# Patient Record
Sex: Female | Born: 1995 | Race: White | Hispanic: No | Marital: Single | Smoking: Never smoker
Health system: Southern US, Community
[De-identification: ages and names within clinical notes are randomized; demographics above are authoritative.]

## PROBLEM LIST (undated history)

## (undated) ENCOUNTER — Emergency Department (HOSPITAL_COMMUNITY): Admission: EM | Payer: Self-pay | Source: Home / Self Care

## (undated) DIAGNOSIS — F4310 Post-traumatic stress disorder, unspecified: Secondary | ICD-10-CM

## (undated) DIAGNOSIS — Z9889 Other specified postprocedural states: Secondary | ICD-10-CM

## (undated) DIAGNOSIS — F32A Depression, unspecified: Secondary | ICD-10-CM

## (undated) DIAGNOSIS — N6452 Nipple discharge: Secondary | ICD-10-CM

## (undated) DIAGNOSIS — Z973 Presence of spectacles and contact lenses: Secondary | ICD-10-CM

## (undated) DIAGNOSIS — N83209 Unspecified ovarian cyst, unspecified side: Secondary | ICD-10-CM

## (undated) DIAGNOSIS — T4145XA Adverse effect of unspecified anesthetic, initial encounter: Secondary | ICD-10-CM

## (undated) DIAGNOSIS — F329 Major depressive disorder, single episode, unspecified: Secondary | ICD-10-CM

## (undated) DIAGNOSIS — N809 Endometriosis, unspecified: Secondary | ICD-10-CM

## (undated) DIAGNOSIS — R112 Nausea with vomiting, unspecified: Secondary | ICD-10-CM

## (undated) DIAGNOSIS — T8859XA Other complications of anesthesia, initial encounter: Secondary | ICD-10-CM

## (undated) DIAGNOSIS — R102 Pelvic and perineal pain: Secondary | ICD-10-CM

## (undated) HISTORY — PX: TYMPANOSTOMY TUBE PLACEMENT: SHX32

## (undated) HISTORY — PX: TONSILLECTOMY: SUR1361

---
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
## 2016-06-28 ENCOUNTER — Encounter (HOSPITAL_BASED_OUTPATIENT_CLINIC_OR_DEPARTMENT_OTHER): Payer: Self-pay | Admitting: *Deleted

## 2016-06-28 NOTE — Progress Notes (Signed)
NPO AFTER MN.  ARRIVE AT 0600. NEEDS URINE PREG.  GETTING LAB WORK DONE Thursday 06-30-2016 (CBC, BMET).

## 2016-06-30 DIAGNOSIS — Z880 Allergy status to penicillin: Secondary | ICD-10-CM | POA: Diagnosis not present

## 2016-06-30 DIAGNOSIS — N946 Dysmenorrhea, unspecified: Secondary | ICD-10-CM | POA: Diagnosis not present

## 2016-06-30 DIAGNOSIS — F4310 Post-traumatic stress disorder, unspecified: Secondary | ICD-10-CM | POA: Diagnosis not present

## 2016-06-30 DIAGNOSIS — N809 Endometriosis, unspecified: Secondary | ICD-10-CM | POA: Diagnosis not present

## 2016-06-30 LAB — BASIC METABOLIC PANEL
Anion gap: 6 (ref 5–15)
BUN: 11 mg/dL (ref 6–20)
CALCIUM: 9.5 mg/dL (ref 8.9–10.3)
CHLORIDE: 107 mmol/L (ref 101–111)
CO2: 25 mmol/L (ref 22–32)
CREATININE: 0.84 mg/dL (ref 0.44–1.00)
GFR calc Af Amer: >60 mL/min (ref >60)
GFR calc non Af Amer: >60 mL/min (ref >60)
GLUCOSE: 87 mg/dL (ref 65–99)
Potassium: 3.8 mmol/L (ref 3.5–5.1)
Sodium: 138 mmol/L (ref 135–145)

## 2016-06-30 LAB — CBC
HCT: 39.5 % (ref 36.0–46.0)
Hemoglobin: 13.9 g/dL (ref 12.0–15.0)
MCH: 31.5 pg (ref 26.0–34.0)
MCHC: 35.2 g/dL (ref 30.0–36.0)
MCV: 89.6 fL (ref 78.0–100.0)
PLATELETS: 197 10*3/uL (ref 150–400)
RBC: 4.41 MIL/uL (ref 3.87–5.11)
RDW: 12 % (ref 11.5–15.5)
WBC: 4.7 10*3/uL (ref 4.0–10.5)

## 2016-07-03 NOTE — H&P (Addendum)
Summer Pruitt is an 21 y.o. female G0 presents for surgical mngt of chronic pelvic pain.  Pt reports severe dysmenorrhea since menarche and sx are worsening.  No improvement of sx with OCPs.     Menstrual History: Patient's last menstrual period was 06/01/2016 (approximate).    Past Medical History:  Diagnosis Date  . Pelvic pain   . PTSD (post-traumatic stress disorder)    brother committed suicide  . Wears glasses     Past Surgical History:  Procedure Laterality Date  . TYMPANOSTOMY TUBE PLACEMENT Bilateral child    History reviewed. No pertinent family history.  Social History:  reports that she has never smoked. She has never used smokeless tobacco. She reports that she does not drink alcohol or use drugs.  Allergies:  Allergies  Allergen Reactions  . Amoxicillin Shortness Of Breath and Rash  . Doxycycline Shortness Of Breath and Rash  . Zithromax [Azithromycin] Shortness Of Breath and Rash    Meds:  latuda, gabapentin, ibuprofen, OCPs, prozonian  ROS  Height 5\' 5"  (1.651 m), weight 146 lb (66.2 kg), last menstrual period 06/01/2016. Physical Exam Gen - NAD Abd - soft, NT/ND Ext - nt, no edema PV - deferred, pt doesn't tolerate well  PV US:  Negative for uterine of adnexal abnormalities  Assessment/Plan: Chronic pelvic pain and dysmenorrhea Diagnostic laparoscopy possible fulgeration of endometriosis r/b/a discussed, questions answered, informed consent  Coty Larsh 07/03/2016, 9:43 PM

## 2016-07-04 ENCOUNTER — Ambulatory Visit (HOSPITAL_BASED_OUTPATIENT_CLINIC_OR_DEPARTMENT_OTHER): Payer: BLUE CROSS/BLUE SHIELD | Admitting: Anesthesiology

## 2016-07-04 ENCOUNTER — Encounter (HOSPITAL_BASED_OUTPATIENT_CLINIC_OR_DEPARTMENT_OTHER)
Admission: RE | Disposition: A | Discharge status: Home / Self Care | Payer: Self-pay | Source: Ambulatory Visit | Attending: Obstetrics and Gynecology

## 2016-07-04 ENCOUNTER — Ambulatory Visit (HOSPITAL_BASED_OUTPATIENT_CLINIC_OR_DEPARTMENT_OTHER)
Admission: RE | Admit: 2016-07-04 | Discharge: 2016-07-04 | Disposition: A | Discharge status: Home / Self Care | Payer: BLUE CROSS/BLUE SHIELD | Source: Ambulatory Visit | Attending: Obstetrics and Gynecology | Admitting: Obstetrics and Gynecology

## 2016-07-04 ENCOUNTER — Encounter (HOSPITAL_BASED_OUTPATIENT_CLINIC_OR_DEPARTMENT_OTHER): Payer: Self-pay

## 2016-07-04 DIAGNOSIS — F4310 Post-traumatic stress disorder, unspecified: Secondary | ICD-10-CM | POA: Insufficient documentation

## 2016-07-04 DIAGNOSIS — N809 Endometriosis, unspecified: Secondary | ICD-10-CM | POA: Insufficient documentation

## 2016-07-04 DIAGNOSIS — Z880 Allergy status to penicillin: Secondary | ICD-10-CM | POA: Insufficient documentation

## 2016-07-04 DIAGNOSIS — N946 Dysmenorrhea, unspecified: Secondary | ICD-10-CM | POA: Insufficient documentation

## 2016-07-04 HISTORY — PX: LAPAROSCOPY: SHX197

## 2016-07-04 HISTORY — DX: Presence of spectacles and contact lenses: Z97.3

## 2016-07-04 HISTORY — DX: Pelvic and perineal pain: R10.2

## 2016-07-04 HISTORY — DX: Post-traumatic stress disorder, unspecified: F43.10

## 2016-07-04 LAB — POCT PREGNANCY, URINE: PREG TEST UR: NEGATIVE

## 2016-07-04 SURGERY — LAPAROSCOPY, DIAGNOSTIC
Anesthesia: General

## 2016-07-04 MED ORDER — SUGAMMADEX SODIUM 500 MG/5ML IV SOLN
INTRAVENOUS | Status: AC
  Filled 2016-07-04: qty 5

## 2016-07-04 MED ORDER — IBUPROFEN 600 MG PO TABS: 600.0000 mg | ORAL_TABLET | Freq: Four times a day (QID) | ORAL | 1 refills | Status: DC | PRN

## 2016-07-04 MED ORDER — LIDOCAINE 2% (20 MG/ML) 5 ML SYRINGE
INTRAMUSCULAR | Status: DC | PRN
  Administered 2016-07-04: 80 mg via INTRAVENOUS

## 2016-07-04 MED ORDER — MIDAZOLAM HCL 2 MG/2ML IJ SOLN
INTRAMUSCULAR | Status: AC
  Filled 2016-07-04: qty 2

## 2016-07-04 MED ORDER — KETOROLAC TROMETHAMINE 30 MG/ML IJ SOLN
INTRAMUSCULAR | Status: DC | PRN
  Administered 2016-07-04: 30 mg via INTRAVENOUS

## 2016-07-04 MED ORDER — ONDANSETRON HCL 4 MG/2ML IJ SOLN
INTRAMUSCULAR | Status: DC | PRN
  Administered 2016-07-04: 4 mg via INTRAVENOUS

## 2016-07-04 MED ORDER — HYDROMORPHONE HCL 1 MG/ML IJ SOLN
0.2500 mg | INTRAMUSCULAR | Status: DC | PRN
  Filled 2016-07-04: qty 0.5

## 2016-07-04 MED ORDER — PROMETHAZINE HCL 25 MG/ML IJ SOLN
6.2500 mg | INTRAMUSCULAR | Status: DC | PRN
  Filled 2016-07-04: qty 1

## 2016-07-04 MED ORDER — SUGAMMADEX SODIUM 200 MG/2ML IV SOLN
INTRAVENOUS | Status: AC
  Filled 2016-07-04: qty 2

## 2016-07-04 MED ORDER — ROCURONIUM BROMIDE 100 MG/10ML IV SOLN
INTRAVENOUS | Status: DC | PRN
  Administered 2016-07-04: 45 mg via INTRAVENOUS

## 2016-07-04 MED ORDER — LIDOCAINE 2% (20 MG/ML) 5 ML SYRINGE
INTRAMUSCULAR | Status: AC
  Filled 2016-07-04: qty 5

## 2016-07-04 MED ORDER — OXYCODONE HCL 5 MG PO TABS
ORAL_TABLET | ORAL | Status: AC
  Filled 2016-07-04: qty 1

## 2016-07-04 MED ORDER — ARTIFICIAL TEARS OP OINT
TOPICAL_OINTMENT | OPHTHALMIC | Status: AC
  Filled 2016-07-04: qty 3.5

## 2016-07-04 MED ORDER — ACETAMINOPHEN 10 MG/ML IV SOLN
1000.0000 mg | Freq: Once | INTRAVENOUS | Status: AC
  Administered 2016-07-04: 1000 mg via INTRAVENOUS
  Filled 2016-07-04: qty 100

## 2016-07-04 MED ORDER — OXYCODONE-ACETAMINOPHEN 5-325 MG PO TABS: 1.0000 | ORAL_TABLET | ORAL | 0 refills | Status: DC | PRN

## 2016-07-04 MED ORDER — BUPIVACAINE-EPINEPHRINE 0.25% -1:200000 IJ SOLN
INTRAMUSCULAR | Status: AC
  Filled 2016-07-04: qty 1

## 2016-07-04 MED ORDER — PROPOFOL 10 MG/ML IV BOLUS
INTRAVENOUS | Status: DC | PRN
  Administered 2016-07-04: 200 mg via INTRAVENOUS

## 2016-07-04 MED ORDER — ACETAMINOPHEN 10 MG/ML IV SOLN
INTRAVENOUS | Status: AC
  Filled 2016-07-04: qty 100

## 2016-07-04 MED ORDER — GENTAMICIN SULFATE 40 MG/ML IJ SOLN
INTRAMUSCULAR | Status: AC
  Administered 2016-07-04: 08:00:00 via INTRAVENOUS
  Filled 2016-07-04: qty 8

## 2016-07-04 MED ORDER — DEXAMETHASONE SODIUM PHOSPHATE 4 MG/ML IJ SOLN
INTRAMUSCULAR | Status: DC | PRN
Start: 2016-07-04 — End: 2016-07-04
  Administered 2016-07-04: 10 mg via INTRAVENOUS

## 2016-07-04 MED ORDER — FENTANYL CITRATE (PF) 100 MCG/2ML IJ SOLN
INTRAMUSCULAR | Status: AC
  Filled 2016-07-04: qty 2

## 2016-07-04 MED ORDER — DEXAMETHASONE SODIUM PHOSPHATE 10 MG/ML IJ SOLN
INTRAMUSCULAR | Status: AC
  Filled 2016-07-04: qty 1

## 2016-07-04 MED ORDER — BUPIVACAINE HCL 0.25 % IJ SOLN
INTRAMUSCULAR | Status: DC | PRN
  Administered 2016-07-04: 4 mL

## 2016-07-04 MED ORDER — PROPOFOL 10 MG/ML IV BOLUS
INTRAVENOUS | Status: AC
  Filled 2016-07-04: qty 40

## 2016-07-04 MED ORDER — ROCURONIUM BROMIDE 50 MG/5ML IV SOSY
PREFILLED_SYRINGE | INTRAVENOUS | Status: AC
  Filled 2016-07-04: qty 5

## 2016-07-04 MED ORDER — SUGAMMADEX SODIUM 200 MG/2ML IV SOLN
INTRAVENOUS | Status: DC | PRN
  Administered 2016-07-04: 300 mg via INTRAVENOUS

## 2016-07-04 MED ORDER — ONDANSETRON HCL 4 MG/2ML IJ SOLN
INTRAMUSCULAR | Status: AC
  Filled 2016-07-04: qty 2

## 2016-07-04 MED ORDER — OXYCODONE HCL 5 MG PO TABS
5.0000 mg | ORAL_TABLET | Freq: Once | ORAL | Status: AC | PRN
  Administered 2016-07-04: 5 mg via ORAL
  Filled 2016-07-04: qty 1

## 2016-07-04 MED ORDER — SUCCINYLCHOLINE CHLORIDE 200 MG/10ML IV SOSY
PREFILLED_SYRINGE | INTRAVENOUS | Status: AC
  Filled 2016-07-04: qty 10

## 2016-07-04 MED ORDER — MIDAZOLAM HCL 5 MG/5ML IJ SOLN
INTRAMUSCULAR | Status: DC | PRN
  Administered 2016-07-04: 2 mg via INTRAVENOUS

## 2016-07-04 MED ORDER — LACTATED RINGERS IV SOLN
INTRAVENOUS | Status: DC
  Administered 2016-07-04 (×2): via INTRAVENOUS
  Filled 2016-07-04: qty 1000

## 2016-07-04 MED ORDER — FENTANYL CITRATE (PF) 100 MCG/2ML IJ SOLN
INTRAMUSCULAR | Status: DC | PRN
  Administered 2016-07-04 (×2): 50 ug via INTRAVENOUS

## 2016-07-04 MED ORDER — OXYCODONE HCL 5 MG/5ML PO SOLN
5.0000 mg | Freq: Once | ORAL | Status: AC | PRN
  Filled 2016-07-04: qty 5

## 2016-07-04 MED ORDER — KETOROLAC TROMETHAMINE 30 MG/ML IJ SOLN
30.0000 mg | Freq: Once | INTRAMUSCULAR | Status: DC | PRN
  Filled 2016-07-04: qty 1

## 2016-07-04 SURGICAL SUPPLY — 55 items
APPLICATOR COTTON TIP 6IN STRL (MISCELLANEOUS) ×2 IMPLANT
BANDAGE ADHESIVE 1X3 (GAUZE/BANDAGES/DRESSINGS) IMPLANT
BENZOIN TINCTURE PRP APPL 2/3 (GAUZE/BANDAGES/DRESSINGS) IMPLANT
BLADE CLIPPER SURG (BLADE) IMPLANT
BLADE SURG 11 STRL SS (BLADE) ×2 IMPLANT
CANISTER SUCT 3000ML PPV (MISCELLANEOUS) IMPLANT
CANISTER SUCTION 1200CC (MISCELLANEOUS) IMPLANT
CATH ROBINSON RED A/P 16FR (CATHETERS) ×2 IMPLANT
COVER MAYO STAND STRL (DRAPES) ×2 IMPLANT
DERMABOND ADVANCED (GAUZE/BANDAGES/DRESSINGS) ×2
DERMABOND ADVANCED .7 DNX12 (GAUZE/BANDAGES/DRESSINGS) ×2 IMPLANT
DRAPE UNDERBUTTOCKS STRL (DRAPE) ×2 IMPLANT
ELECT REM PT RETURN 9FT ADLT (ELECTROSURGICAL) ×2
ELECTRODE REM PT RTRN 9FT ADLT (ELECTROSURGICAL) ×1 IMPLANT
GLOVE BIO SURGEON STRL SZ 6.5 (GLOVE) ×2 IMPLANT
GLOVE BIO SURGEON STRL SZ7 (GLOVE) ×2 IMPLANT
GLOVE INDICATOR 7.0 STRL GRN (GLOVE) ×2 IMPLANT
GOWN STRL REUS W/ TWL LRG LVL3 (GOWN DISPOSABLE) ×2 IMPLANT
GOWN STRL REUS W/TWL LRG LVL3 (GOWN DISPOSABLE) ×2
HOLDER FOLEY CATH W/STRAP (MISCELLANEOUS) IMPLANT
KIT RM TURNOVER CYSTO AR (KITS) ×2 IMPLANT
LEGGING LITHOTOMY PAIR STRL (DRAPES) ×2 IMPLANT
NEEDLE HYPO 25X1 1.5 SAFETY (NEEDLE) ×2 IMPLANT
NEEDLE INSUFFLATION 120MM (ENDOMECHANICALS) ×2 IMPLANT
NEEDLE INSUFFLATION 14GA 120MM (NEEDLE) IMPLANT
NEEDLE INSUFFLATION 14GA 150MM (NEEDLE) IMPLANT
NS IRRIG 500ML POUR BTL (IV SOLUTION) ×2 IMPLANT
PACK BASIN DAY SURGERY FS (CUSTOM PROCEDURE TRAY) ×2 IMPLANT
PACK LAPAROSCOPY II (CUSTOM PROCEDURE TRAY) ×2 IMPLANT
PAD ION LEFT ARM DISP (MISCELLANEOUS) ×2 IMPLANT
PAD OB MATERNITY 4.3X12.25 (PERSONAL CARE ITEMS) ×2 IMPLANT
PAD PREP 24X48 CUFFED NSTRL (MISCELLANEOUS) ×2 IMPLANT
PENCIL BUTTON HOLSTER BLD 10FT (ELECTRODE) IMPLANT
POUCH SPECIMEN RETRIEVAL 10MM (ENDOMECHANICALS) IMPLANT
SCISSORS LAP 5X35 DISP (ENDOMECHANICALS) IMPLANT
SEALER TISSUE G2 CVD JAW 35 (ENDOMECHANICALS) IMPLANT
SEALER TISSUE G2 CVD JAW 45CM (ENDOMECHANICALS) IMPLANT
SET IRRIG TUBING LAPAROSCOPIC (IRRIGATION / IRRIGATOR) IMPLANT
SOLUTION ANTI FOG 6CC (MISCELLANEOUS) ×2 IMPLANT
STRIP CLOSURE SKIN 1/2X4 (GAUZE/BANDAGES/DRESSINGS) IMPLANT
SUT VIC AB 3-0 PS2 18 (SUTURE) ×2
SUT VIC AB 3-0 PS2 18XBRD (SUTURE) ×2 IMPLANT
SUT VICRYL 0 UR6 27IN ABS (SUTURE) ×4 IMPLANT
SYR 3ML 23GX1 SAFETY (SYRINGE) IMPLANT
SYR CONTROL 10ML LL (SYRINGE) ×2 IMPLANT
SYRINGE 10CC LL (SYRINGE) IMPLANT
TOWEL OR 17X24 6PK STRL BLUE (TOWEL DISPOSABLE) ×4 IMPLANT
TRAY DSU PREP LF (CUSTOM PROCEDURE TRAY) ×2 IMPLANT
TRAY FOLEY CATH SILVER 14FR (SET/KITS/TRAYS/PACK) IMPLANT
TROCAR OPTI TIP 5M 100M (ENDOMECHANICALS) ×4 IMPLANT
TROCAR XCEL BLUNT TIP 100MML (ENDOMECHANICALS) IMPLANT
TROCAR XCEL NON-BLD 11X100MML (ENDOMECHANICALS) ×4 IMPLANT
TUBE CONNECTING 12X1/4 (SUCTIONS) IMPLANT
TUBING INSUF HEATED (TUBING) ×2 IMPLANT
WATER STERILE IRR 500ML POUR (IV SOLUTION) ×2 IMPLANT

## 2016-07-04 NOTE — Anesthesia Preprocedure Evaluation (Signed)

## 2016-07-04 NOTE — Op Note (Signed)
NAME:  ,                                 ACCOUNT NO.:  MEDICAL RECORD NO.:  [id number]  LOCATION:                                 FACILITY:  PHYSICIAN:  Zelphia CairoGretchen Gunther Zawadzki, MD         DATE OF BIRTH:  DATE OF PROCEDURE:  07/04/2016 DATE OF DISCHARGE:                              OPERATIVE REPORT   PREOPERATIVE DIAGNOSIS:  Chronic pelvic pain.  POSTOPERATIVE DIAGNOSIS:  Endometriosis.  SURGEON:  Zelphia CairoGretchen Mariette Cowley, MD  ANESTHESIA:  General.  SPECIMENS:  None.  PROCEDURE:  Diagnostic laparoscopy with fulguration of endometriosis.  ESTIMATED BLOOD LOSS:  Minimal.  DESCRIPTION OF PROCEDURE:  The patient was taken to the operating room after informed consent was obtained.  A time-out was performed.  She was given general anesthesia, placed in the dorsal lithotomy position using Allen stirrups.  She was prepped and draped in sterile fashion and in- and-out catheter was used to drain her bladder.  Bivalve speculum was placed in the vagina and a Hulka clamp was placed on the cervix. Speculum was removed and our attention was turned to the abdomen.  A 0.25% Marcaine was used to provide local anesthesia and an infraumbilical skin incision was made with a scalpel.  This was extended bluntly to the level of the fascia using a Kelly clamp.  Optical trocar was then inserted and a survey was performed.  Right upper quadrant appeared normal.  Appendix appeared normal.  Bilateral ovaries, fallopian tube, and uterus appeared normal.  She had a few small lesions of endometriosis noted in the left ovarian fossa in the posterior cul-de- sac.  These were cauterized using the bipolar.  All instruments were then removed from the abdomen.  A deep stitch was placed in the infraumbilical incision.  The skin incisions were closed with Vicryl. Dermabond was placed.  The Hulka clamp was removed.  The patient was extubated and taken to the recovery room in stable condition.  Sponge, lap, needle, and  instrument counts were correct x2.     Zelphia CairoGretchen Lailany Enoch, MD    GA/MEDQ  D:  07/04/2016  T:  07/04/2016  Job:  [id number]

## 2016-07-04 NOTE — Anesthesia Postprocedure Evaluation (Signed)
Anesthesia Post Note  Patient: Summer Pruitt  Procedure(s) Performed: Procedure(s) (LRB): LAPAROSCOPY DIAGNOSTIC possible fulgeration of endometriosis (N/A)  Patient location during evaluation: PACU Anesthesia Type: General Level of consciousness: awake and alert Pain management: pain level controlled Vital Signs Assessment: post-procedure vital signs reviewed and stable Respiratory status: spontaneous breathing, nonlabored ventilation, respiratory function stable and patient connected to nasal cannula oxygen Cardiovascular status: blood pressure returned to baseline and stable Postop Assessment: no signs of nausea or vomiting Anesthetic complications: no       Last Vitals:  Vitals:   07/04/16 0609 07/04/16 0830  BP: 118/63 125/74  Pulse: 81 (!) 102  Resp: 14 12  Temp: 36.9 C 36.7 C    Last Pain:  Vitals:   07/04/16 0609  TempSrc: Oral                 Lucia Harm S

## 2016-07-04 NOTE — Transfer of Care (Signed)
  Last Vitals:  Vitals:   07/04/16 0609 07/04/16 0830  BP: 118/63   Pulse: 81 (!) 102  Resp: 14   Temp: 36.9 C 36.7 C    Last Pain:  Vitals:   07/04/16 0609  TempSrc: Oral      Patients Stated Pain Goal: 6 (07/04/16 0650) Immediate Anesthesia Transfer of Care Note  Patient: Summer Pruitt  Procedure(s) Performed: Procedure(s) (LRB): LAPAROSCOPY DIAGNOSTIC possible fulgeration of endometriosis (N/A)  Patient Location: PACU  Anesthesia Type: General  Level of Consciousness: awake, alert  and oriented  Airway & Oxygen Therapy: Patient Spontanous Breathing and Patient connected to nasal cannula oxygen  Post-op Assessment: Report given to PACU RN and Post -op Vital signs reviewed and stable  Post vital signs: Reviewed and stable  Complications: No apparent anesthesia complications

## 2016-07-04 NOTE — Discharge Instructions (Signed)
HOME CARE INSTRUCTIONS - LAPAROSCOPY  Wound Care: The bandaids or dressing which are placed over the skin openings may be removed the day after surgery. The incision should be kept clean and dry. The stitches do not need to be removed. Should the incision become sore, red, and swollen after the first week, check with your doctor.  Personal Hygiene: Shower the day after your procedure. Always wipe from front to back after elimination.   Activity: Do not drive or operate any equipment today. The effects of the anesthesia are still present and drowsiness may result. Rest today, not necessarily flat bed rest, just take it easy. You may resume your normal activity in one to three days or as instructed by your physician.  Sexual Activity: You resume sexual activity as indicated by your physician_________.  Diet: Eat a light diet as desired this evening. You may resume a regular diet tomorrow.  Return to Work: Two to three days or as indicated by your doctor.  Expectations After Surgery: Your surgery will cause vaginal drainage or spotting which may continue for 2-3 days. Mild abdominal discomfort or tenderness is not unusual and some shoulder pain may also be noted which can be relieved by lying flat in pain.  Call Your Doctor If these Occur:  Persistent or heavy bleeding at incision site       Redness or swelling around incision       Elevation of temperature greater than 100 degrees F  Call for follow-up appointment _____________.   Post Anesthesia Home Care Instructions  Activity: Get plenty of rest for the remainder of the day. A responsible individual must stay with you for 24 hours following the procedure.  For the next 24 hours, DO NOT: -Drive a car -Advertising copywriterperate machinery -Drink alcoholic beverages -Take any medication unless instructed by your physician -Make any legal decisions or sign important papers.  Meals: Start with liquid foods such as gelatin or soup. Progress to regular  foods as tolerated. Avoid greasy, spicy, heavy foods. If nausea and/or vomiting occur, drink only clear liquids until the nausea and/or vomiting subsides. Call your physician if vomiting continues.  Special Instructions/Symptoms: Your throat may feel dry or sore from the anesthesia or the breathing tube placed in your throat during surgery. If this causes discomfort, gargle with warm salt water. The discomfort should disappear within 24 hours.  If you had a scopolamine patch placed behind your ear for the management of post- operative nausea and/or vomiting:  1. The medication in the patch is effective for 72 hours, after which it should be removed.  Wrap patch in a tissue and discard in the trash. Wash hands thoroughly with soap and water. 2. You may remove the patch earlier than 72 hours if you experience unpleasant side effects which may include dry mouth, dizziness or visual disturbances. 3. Avoid touching the patch. Wash your hands with soap and water after contact with the patch.

## 2016-07-04 NOTE — Anesthesia Procedure Notes (Addendum)
Procedure Name: Intubation Date/Time: 07/04/2016 7:35 AM Performed by: Myrtie Soman Pre-anesthesia Checklist: Patient identified, Emergency Drugs available, Suction available and Patient being monitored Patient Re-evaluated:Patient Re-evaluated prior to inductionOxygen Delivery Method: Circle system utilized Preoxygenation: Pre-oxygenation with 100% oxygen Intubation Type: IV induction Ventilation: Mask ventilation without difficulty Laryngoscope Size: Mac and 3 Grade View: Grade I Tube type: Oral Tube size: 7.0 mm Number of attempts: 1 Airway Equipment and Method: Stylet and Oral airway Placement Confirmation: ETT inserted through vocal cords under direct vision,  positive ETCO2 and breath sounds checked- equal and bilateral Secured at: 21 cm Tube secured with: Tape Dental Injury: Teeth and Oropharynx as per pre-operative assessment

## 2016-07-05 ENCOUNTER — Encounter (HOSPITAL_BASED_OUTPATIENT_CLINIC_OR_DEPARTMENT_OTHER): Payer: Self-pay | Admitting: Obstetrics and Gynecology

## 2016-09-12 NOTE — Addendum Note (Signed)
Addendum  created 09/12/16 1242 by Arrie Zuercher, MD   Sign clinical note    

## 2016-09-12 NOTE — Anesthesia Postprocedure Evaluation (Signed)
Anesthesia Post Note  Patient: Summer Pruitt  Procedure(s) Performed: Procedure(s) (LRB): LAPAROSCOPY DIAGNOSTIC possible fulgeration of endometriosis (N/A)     Anesthesia Post Evaluation  Last Vitals:  Vitals:   07/04/16 0900 07/04/16 0915  BP: 115/64 111/69  Pulse: 69 70  Resp: 14 (!) 21  Temp:      Last Pain:  Vitals:   07/04/16 0934  TempSrc:   PainSc: 5                  Treva Huyett S

## 2017-01-13 DIAGNOSIS — F411 Generalized anxiety disorder: Secondary | ICD-10-CM | POA: Diagnosis present

## 2017-05-26 ENCOUNTER — Other Ambulatory Visit: Payer: Self-pay | Admitting: Obstetrics and Gynecology

## 2017-05-26 DIAGNOSIS — N6452 Nipple discharge: Secondary | ICD-10-CM

## 2017-06-01 ENCOUNTER — Ambulatory Visit
Admission: RE | Admit: 2017-06-01 | Discharge: 2017-06-01 | Disposition: A | Discharge status: Home / Self Care | Payer: BLUE CROSS/BLUE SHIELD | Source: Ambulatory Visit | Attending: Obstetrics and Gynecology | Admitting: Obstetrics and Gynecology

## 2017-06-01 DIAGNOSIS — N6452 Nipple discharge: Secondary | ICD-10-CM

## 2017-06-01 HISTORY — DX: Nipple discharge: N64.52

## 2018-01-04 ENCOUNTER — Encounter (HOSPITAL_COMMUNITY): Payer: Self-pay | Admitting: Family Medicine

## 2018-01-04 ENCOUNTER — Emergency Department (HOSPITAL_COMMUNITY): Payer: BLUE CROSS/BLUE SHIELD

## 2018-01-04 ENCOUNTER — Observation Stay (HOSPITAL_COMMUNITY): Payer: BLUE CROSS/BLUE SHIELD

## 2018-01-04 ENCOUNTER — Observation Stay (HOSPITAL_COMMUNITY)
Admission: EM | Admit: 2018-01-04 | Discharge: 2018-01-05 | Disposition: A | Discharge status: Home / Self Care | Payer: BLUE CROSS/BLUE SHIELD | Attending: Internal Medicine | Admitting: Internal Medicine

## 2018-01-04 ENCOUNTER — Other Ambulatory Visit: Payer: Self-pay

## 2018-01-04 DIAGNOSIS — F411 Generalized anxiety disorder: Secondary | ICD-10-CM | POA: Diagnosis not present

## 2018-01-04 DIAGNOSIS — F4310 Post-traumatic stress disorder, unspecified: Secondary | ICD-10-CM | POA: Insufficient documentation

## 2018-01-04 DIAGNOSIS — F329 Major depressive disorder, single episode, unspecified: Secondary | ICD-10-CM | POA: Diagnosis not present

## 2018-01-04 DIAGNOSIS — F909 Attention-deficit hyperactivity disorder, unspecified type: Secondary | ICD-10-CM | POA: Diagnosis not present

## 2018-01-04 DIAGNOSIS — Z880 Allergy status to penicillin: Secondary | ICD-10-CM | POA: Diagnosis not present

## 2018-01-04 DIAGNOSIS — J982 Interstitial emphysema: Principal | ICD-10-CM | POA: Diagnosis present

## 2018-01-04 DIAGNOSIS — J302 Other seasonal allergic rhinitis: Secondary | ICD-10-CM | POA: Diagnosis not present

## 2018-01-04 DIAGNOSIS — F32A Depression, unspecified: Secondary | ICD-10-CM | POA: Diagnosis present

## 2018-01-04 DIAGNOSIS — K668 Other specified disorders of peritoneum: Secondary | ICD-10-CM

## 2018-01-04 DIAGNOSIS — F900 Attention-deficit hyperactivity disorder, predominantly inattentive type: Secondary | ICD-10-CM | POA: Diagnosis present

## 2018-01-04 HISTORY — DX: Major depressive disorder, single episode, unspecified: F32.9

## 2018-01-04 HISTORY — DX: Depression, unspecified: F32.A

## 2018-01-04 HISTORY — DX: Other specified postprocedural states: Z98.890

## 2018-01-04 HISTORY — DX: Endometriosis, unspecified: N80.9

## 2018-01-04 HISTORY — DX: Adverse effect of unspecified anesthetic, initial encounter: T41.45XA

## 2018-01-04 HISTORY — DX: Other complications of anesthesia, initial encounter: T88.59XA

## 2018-01-04 HISTORY — DX: Other specified postprocedural states: R11.2

## 2018-01-04 LAB — I-STAT CHEM 8, ED
BUN: 8 mg/dL (ref 6–20)
CALCIUM ION: 1.21 mmol/L (ref 1.15–1.40)
CREATININE: 0.7 mg/dL (ref 0.44–1.00)
Chloride: 102 mmol/L (ref 98–111)
Glucose, Bld: 93 mg/dL (ref 70–99)
HCT: 34 % — ABNORMAL LOW (ref 36.0–46.0)
Hemoglobin: 11.6 g/dL — ABNORMAL LOW (ref 12.0–15.0)
Potassium: 3.5 mmol/L (ref 3.5–5.1)
Sodium: 138 mmol/L (ref 135–145)
TCO2: 24 mmol/L (ref 22–32)

## 2018-01-04 LAB — RAPID URINE DRUG SCREEN, HOSP PERFORMED
Amphetamines: NOT DETECTED
BENZODIAZEPINES: POSITIVE — AB
Barbiturates: NOT DETECTED
COCAINE: NOT DETECTED
OPIATES: NOT DETECTED
Tetrahydrocannabinol: NOT DETECTED

## 2018-01-04 LAB — I-STAT BETA HCG BLOOD, ED (MC, WL, AP ONLY)

## 2018-01-04 LAB — I-STAT CG4 LACTIC ACID, ED: Lactic Acid, Venous: 1.22 mmol/L (ref 0.5–1.9)

## 2018-01-04 LAB — HIV ANTIBODY (ROUTINE TESTING W REFLEX): HIV SCREEN 4TH GENERATION: NONREACTIVE

## 2018-01-04 MED ORDER — ONDANSETRON HCL 4 MG PO TABS: 4.0000 mg | ORAL_TABLET | Freq: Four times a day (QID) | ORAL | Status: DC | PRN

## 2018-01-04 MED ORDER — SERTRALINE HCL 50 MG PO TABS
150.0000 mg | ORAL_TABLET | Freq: Every day | ORAL | Status: DC
  Administered 2018-01-04: 150 mg via ORAL
  Filled 2018-01-04: qty 1

## 2018-01-04 MED ORDER — DIATRIZOATE MEGLUMINE & SODIUM 66-10 % PO SOLN
ORAL | Status: AC
  Filled 2018-01-04: qty 30

## 2018-01-04 MED ORDER — FENTANYL CITRATE (PF) 100 MCG/2ML IJ SOLN
50.0000 ug | Freq: Once | INTRAMUSCULAR | Status: AC
  Administered 2018-01-04: 50 ug via INTRAVENOUS
  Filled 2018-01-04: qty 2

## 2018-01-04 MED ORDER — IOHEXOL 300 MG/ML  SOLN
75.0000 mL | Freq: Once | INTRAMUSCULAR | Status: AC | PRN
  Administered 2018-01-04: 75 mL via INTRAVENOUS

## 2018-01-04 MED ORDER — BISACODYL 5 MG PO TBEC: 5.0000 mg | DELAYED_RELEASE_TABLET | Freq: Every day | ORAL | Status: DC | PRN

## 2018-01-04 MED ORDER — ACETAMINOPHEN 650 MG RE SUPP: 650.0000 mg | Freq: Four times a day (QID) | RECTAL | Status: DC | PRN

## 2018-01-04 MED ORDER — OXYCODONE-ACETAMINOPHEN 5-325 MG PO TABS
1.0000 | ORAL_TABLET | ORAL | Status: DC | PRN
  Administered 2018-01-04 – 2018-01-05 (×4): 1 via ORAL
  Filled 2018-01-04 (×4): qty 1

## 2018-01-04 MED ORDER — SENNOSIDES-DOCUSATE SODIUM 8.6-50 MG PO TABS
1.0000 | ORAL_TABLET | Freq: Every evening | ORAL | Status: DC | PRN
  Administered 2018-01-05: 1 via ORAL
  Filled 2018-01-04: qty 1

## 2018-01-04 MED ORDER — ONDANSETRON HCL 4 MG/2ML IJ SOLN: 4.0000 mg | Freq: Four times a day (QID) | INTRAMUSCULAR | Status: DC | PRN

## 2018-01-04 MED ORDER — DIPHENHYDRAMINE HCL 25 MG PO CAPS: 25.0000 mg | ORAL_CAPSULE | Freq: Four times a day (QID) | ORAL | Status: DC | PRN

## 2018-01-04 MED ORDER — ACETAMINOPHEN 325 MG PO TABS: 650.0000 mg | ORAL_TABLET | Freq: Four times a day (QID) | ORAL | Status: DC | PRN

## 2018-01-04 MED ORDER — GUAIFENESIN-DM 100-10 MG/5ML PO SYRP: 5.0000 mL | ORAL_SOLUTION | ORAL | Status: DC | PRN

## 2018-01-04 MED ORDER — MAGNESIUM CITRATE PO SOLN: 1.0000 | Freq: Once | ORAL | Status: DC | PRN

## 2018-01-04 MED ORDER — FENTANYL CITRATE (PF) 100 MCG/2ML IJ SOLN: 25.0000 ug | INTRAMUSCULAR | Status: DC | PRN

## 2018-01-04 MED ORDER — ALPRAZOLAM 0.5 MG PO TABS
0.5000 mg | ORAL_TABLET | Freq: Two times a day (BID) | ORAL | Status: DC | PRN
  Administered 2018-01-04: 0.5 mg via ORAL
  Filled 2018-01-04: qty 1

## 2018-01-04 MED ORDER — SODIUM CHLORIDE 0.9 % IV BOLUS (SEPSIS)
1000.0000 mL | Freq: Once | INTRAVENOUS | Status: AC
  Administered 2018-01-04: 1000 mL via INTRAVENOUS

## 2018-01-04 MED ORDER — IBUPROFEN 600 MG PO TABS
600.0000 mg | ORAL_TABLET | Freq: Four times a day (QID) | ORAL | Status: DC | PRN
  Administered 2018-01-05: 600 mg via ORAL
  Filled 2018-01-04: qty 1

## 2018-01-04 MED ORDER — FENTANYL CITRATE (PF) 100 MCG/2ML IJ SOLN
50.0000 ug | INTRAMUSCULAR | Status: DC | PRN
  Administered 2018-01-04: 50 ug via INTRAVENOUS
  Filled 2018-01-04: qty 2

## 2018-01-04 NOTE — ED Triage Notes (Signed)
Coming from [facility].  Pt complaint of  Anterior neck pain that radiates to mid chest. CT in center confirmed R apical pneumothorax with air pockets in thyroid. Possible esophogeal perforation. Pt denies chest pain. Given flagyl,and ativan at facility. Denies SOB. Pain worst when swallowing and expiration.

## 2018-01-04 NOTE — ED Provider Notes (Signed)
[facility] EMERGENCY DEPARTMENT Provider Note   CSN: [id number] Arrival date & time: 01/04/18  0136     History   Chief Complaint Chief Complaint  Patient presents with  . Chest Injury  . Chest Pain    HPI Summer Pruitt is a 22 y.o. female.  The history is provided by the patient.  Patient presents from outside facility for further evaluation of neck pain.  On September 25 at approximately 1500 she began having neck pain and voice changes.  The neck pain moves into her chest  she denies any trauma.  Denies eating any foods that could have triggered the pain.  She reports pain in her neck is worse with movement and breathing, improved with rest.  No fevers or vomiting.  She denies any other complaints. Was seen in outside facility and diagnosed with possible esophageal perforation.  She was given IV antibiotics, and sent for further evaluation. She reports her symptoms are worsening. Past Medical History:  Diagnosis Date  . Breast discharge    bilateral, spontaneous/with compression, clear-milky   . Pelvic pain   . PTSD (post-traumatic stress disorder)    brother committed suicide  . Wears glasses     There are no active problems to display for this patient.   Past Surgical History:  Procedure Laterality Date  . LAPAROSCOPY N/A 07/04/2016   Procedure: LAPAROSCOPY DIAGNOSTIC possible fulgeration of endometriosis;  Surgeon: Zelphia Cairo, MD;  Location: [facility];  Service: Gynecology;  Laterality: N/A;  . TYMPANOSTOMY TUBE PLACEMENT Bilateral child     OB History   None      Home Medications    Prior to Admission medications   Medication Sig Start Date End Date Taking? Authorizing Provider  gabapentin (NEURONTIN) 300 MG capsule Take 300 mg by mouth at bedtime.    [provider]  ibuprofen (ADVIL,MOTRIN) 600 MG tablet Take 1 tablet (600 mg total) by mouth every 6 (six) hours as needed. 07/04/16   Zelphia Cairo,  MD  lurasidone (LATUDA) 40 MG TABS tablet Take 40 mg by mouth every evening.    [provider]  norethindrone-ethinyl estradiol 1/35 (ORTHO-NOVUM, NORTREL,CYCLAFEM) tablet Take 1 tablet by mouth every evening.    [provider]  oxyCODONE-acetaminophen (ROXICET) 5-325 MG tablet Take 1-2 tablets by mouth every 4 (four) hours as needed for severe pain. 07/04/16   Zelphia Cairo, MD  prazosin (MINIPRESS) 1 MG capsule Take 1 mg by mouth at bedtime.    [provider]    Family History No family history on file.  Social History Social History   Tobacco Use  . Smoking status: Never Smoker  . Smokeless tobacco: Never Used  Substance Use Topics  . Alcohol use: No  . Drug use: No     Allergies   Amoxicillin; Doxycycline; and Zithromax [azithromycin]   Review of Systems Review of Systems  Constitutional: Negative for fever.  HENT: Positive for voice change.   Musculoskeletal: Positive for neck pain.  Neurological: Negative for weakness.  All other systems reviewed and are negative.    Physical Exam Updated Vital Signs BP (!) 94/51   Pulse 88   Resp 20   Ht 1.651 m (5\' 5" )   Wt 63.5 kg   SpO2 99%   BMI 23.30 kg/m   Physical Exam CONSTITUTIONAL: Well developed/well nourished HEAD: Normocephalic/atraumatic EYES: EOMI ENMT: Mucous membranes moist, uvula midline.  No stridor, mild dysphonia noted NECK:  no crepitus noted SPINE/BACK:entire spine  nontender CV: S1/S2 noted, no murmurs/rubs/gallops noted LUNGS: Lungs are clear to auscultation bilaterally, no apparent distress ABDOMEN: soft NEURO: Pt is awake/alert/appropriate, moves all extremitiesx4.  No facial droop.   EXTREMITIES:  full ROM SKIN: warm, color normal PSYCH: no abnormalities of mood noted, alert and oriented to situation   ED Treatments / Results  Labs (all labs ordered are listed, but only abnormal results are displayed) Labs Reviewed  I-STAT CHEM 8, ED - Abnormal;  Notable for the following components:      Result Value   Hemoglobin 11.6 (*)    HCT 34.0 (*)    All other components within normal limits  I-STAT BETA HCG BLOOD, ED ([facility], [facility], [facility] ONLY)  I-STAT CG4 LACTIC ACID, ED    EKG None  Radiology Ct Chest W Contrast  Result Date: 01/04/2018 CLINICAL DATA:  22 year old female with neck pain radiating down to the chest. Rule out esophageal leak. EXAM: CT CHEST WITH CONTRAST TECHNIQUE: Multidetector CT imaging of the chest was performed during intravenous contrast administration. CONTRAST:  75mL OMNIPAQUE IOHEXOL 300 MG/ML  SOLN COMPARISON:  None. FINDINGS: Cardiovascular: There is no cardiomegaly or pericardial effusion. The thoracic aorta is unremarkable. The visualized origins of the great vessels of the aortic arch are patent. The central pulmonary arteries appear unremarkable. Mediastinum/Nodes: There is no hilar or mediastinal adenopathy. There is extensive pneumomediastinum dissecting the fascial plane and extending up into the neck. As per instruction patient drank oral contrast during imaging. There is oral contrast in the mid and distal esophagus. No contrast identified outside of the confines of the esophageal wall. Esophagus appears grossly unremarkable. No thyroid nodules. Lungs/Pleura: The lungs are clear. There is no pleural effusion. Minimal amount of air noted along the right posterior pleural surface, extending from the pneumomediastinum. Upper Abdomen: No acute abnormality. Musculoskeletal: No chest wall abnormality. No acute or significant osseous findings. IMPRESSION: Extensive pneumomediastinum of indeterminate etiology. No extraluminal extension of oral contrast. Clinical correlation is recommended. These results were called by telephone at the time of interpretation on 01/04/2018 at 3:50 am to Dr. Zadie Rhine , who verbally acknowledged these results. Electronically Signed   By: Elgie Collard M.D.   On: 01/04/2018 03:54     Procedures Procedures  CRITICAL CARE Performed by: Joya Gaskins Total critical care time: 31 minutes Critical care time was exclusive of separately billable procedures and treating other patients. Critical care was necessary to treat or prevent imminent or life-threatening deterioration. Critical care was time spent personally by me on the following activities: development of treatment plan with patient and/or surrogate as well as nursing, discussions with consultants, evaluation of patient's response to treatment, examination of patient, obtaining history from patient or surrogate, ordering and performing treatments and interventions, ordering and review of laboratory studies, ordering and review of radiographic studies, pulse oximetry and re-evaluation of patient's condition.   Medications Ordered in ED Medications  sodium chloride 0.9 % bolus 1,000 mL (1,000 mLs Intravenous New Bag/Given 01/04/18 0231)  fentaNYL (SUBLIMAZE) injection 50 mcg (50 mcg Intravenous Given 01/04/18 0231)     Initial Impression / Assessment and Plan / ED Course  I have reviewed the triage vital signs and the nursing notes.  Pertinent labs/imaging  results that were available during my care of the patient were reviewed by me and considered in my medical decision making (see chart for details).     2:38 AM Patient presents from outside facility for concern for esophageal perforation.  Clinically she is very  stable at this time.  I discussed the case with radiology, and they recommend CT chest with IV contrast and oral water-soluble contrast.  This has been ordered.  We will keep patient n.p.o. She has already received IV antibiotics 4:37 AM I discussed the case with radiology, there are no signs of any esophageal perforation.  Patient appears to have had spontaneous pneumomediastinum. Patient denies any recent forceful vomiting, denies any recent coughing.  No trauma is reported.  She does not think it  was food related.  She will be admitted to the hospital for pain control/ monitoring. I discussed the case with patient and her mother, they agree with plan.  Discussed the case with Dr. [name] for admission. Final Clinical Impressions(s) / ED Diagnoses   Final diagnoses:  Pneumomediastinum [facility])    ED Discharge Orders    None       Zadie Rhine, MD 01/04/18 [phone]

## 2018-01-04 NOTE — Progress Notes (Addendum)
PROGRESS NOTE    Summer Pruitt  GMW:[id number] DOB: [birth year] DOA: 01/04/2018 PCP: [facility]   Brief Narrative:   Kadesha A Simmonsis a 22 y.o.femalewith medical history significant fordepression, anxiety, and ADHD,presenting to the ED for evaluation of anterior neck pain. She was having seasonal allergies with sneezing, when she developed acute onset of pain at the anterior neck at approximately 3 PM on 01/03/2018. No preceding trauma, coughing, or vomiting. She denies any smoking or drug use but does admit to vaping. She was seen at urgent care then directed to the ED at [facility] due to concern for PTX.  CT  Chest was suspicious for possible pneumothorax and esophageal perforation. ED physician consulted with cardiothoracic surgeon here at [facility] who recommended transfer to [facility]. At [facility],  CBC and  chemistry panel were normal. Osats were normal in RA and VSS.CT chest with contrast showed extensive pneumomediastinum, normal-appearing esophagus. She received IV fluids and pain meds, has remained hemodynamically stable  New CXR today does not show worsening findings but still complaining of anterior esophageal pain    Assessment & Plan:   Principal Problem:   Pneumomediastinum (HCC) Active Problems:   ADHD, predominantly inattentive type   Depression   GAD (generalized anxiety disorder)   Spontaneous pneumomediastinum, likely due to Vaping. New CXR today does not reveal new findings, stable pneumomediastinum Instructed to discontinue vaping  Refrain from coughing or valsalva until symptoms resolve.  Continue analgesics and home oxycodone LIkely dc tomorrow  ADHD Continue Adderal  Depression Continue Zoloft    DVT prophylaxis: SCD Code Status: FUll code   Family Communication: discussed with patient and her mother  Disposition Plan:   Consultants:    None  Procedures:  None  Antimicrobials:   None    Subjective: Pain is slowly improving  Objective: Vitals:   01/04/18 0430 01/04/18 0445 01/04/18 0555 01/04/18 1351  BP: (!) 95/56 (!) 95/57 100/63 (!) 102/59  Pulse: 76 77 66 78  Resp: 14 14 19 14   Temp:   97.8 F (36.6 C) 97.9 F (36.6 C)  TempSrc:   Oral Oral  SpO2: 98% 99% 95% 100%  Weight:   66.2 kg   Height:   5\' 5"  (1.651 m)     Intake/Output Summary (Last 24 hours) at 01/04/2018 1457 Last data filed at 01/04/2018 1354 Gross per 24 hour  Intake 1120 ml  Output -  Net 1120 ml   Filed Weights   01/04/18 0212 01/04/18 0555  Weight: 63.5 kg 66.2 kg    Examination:  General exam: Appears calm but uncomfortable-- no crepitus felt in neck Respiratory system: Clear to auscultation. Respiratory effort normal. Cardiovascular system: S1 & S2 heard, RRR. No JVD, murmurs, rubs, gallops or clicks. No pedal edema. Gastrointestinal system: Abdomen is nondistended, soft and nontender. No organomegaly or masses felt. Normal bowel sounds heard. Central nervous system: Alert and oriented. No focal neurological deficits. Extremities: Symmetric 5 x 5 power. Skin: No rashes, lesions or ulcers Psychiatry: Judgement and insight appear normal. Mood & affect appropriate.     Data Reviewed: I have personally reviewed following labs and imaging studies  CBC: Recent Labs  Lab 01/04/18 0226  HGB 11.6*  HCT 34.0*   Basic Metabolic Panel: Recent Labs  Lab 01/04/18 0226  NA 138  K 3.5  CL 102  GLUCOSE 93  BUN 8  CREATININE 0.70   GFR: Estimated Creatinine Clearance: 100.1 mL/min (by C-G formula based on SCr  of 0.7 mg/dL). Liver Function Tests: No results for input(s): AST, ALT, ALKPHOS, BILITOT, PROT, ALBUMIN in the last 168 hours. No results for input(s): LIPASE, AMYLASE in the last 168 hours. No results for input(s): AMMONIA in the last 168 hours. Coagulation Profile: No results for input(s): INR, PROTIME in  the last 168 hours. Cardiac Enzymes: No results for input(s): CKTOTAL, CKMB, CKMBINDEX, TROPONINI in the last 168 hours. BNP (last 3 results) No results for input(s): PROBNP in the last 8760 hours. HbA1C: No results for input(s): HGBA1C in the last 72 hours. CBG: No results for input(s): GLUCAP in the last 168 hours. Lipid Profile: No results for input(s): CHOL, HDL, LDLCALC, TRIG, CHOLHDL, LDLDIRECT in the last 72 hours. Thyroid Function Tests: No results for input(s): TSH, T4TOTAL, FREET4, T3FREE, THYROIDAB in the last 72 hours. Anemia Panel: No results for input(s): VITAMINB12, FOLATE, FERRITIN, TIBC, IRON, RETICCTPCT in the last 72 hours. Sepsis Labs: Recent Labs  Lab 01/04/18 0226  LATICACIDVEN 1.22    No results found for this or any previous visit (from the past 240 hour(s)).       Radiology Studies: Dg Chest 2 View  Result Date: 01/04/2018 CLINICAL DATA:  Neck and mid sternal chest pain beginning yesterday associated with difficulty swallowing or breathing in deeply. Pain extends from the neck into the mid sternal region. EXAM: CHEST - 2 VIEW COMPARISON:  CT scan of the chest of January 04, 2018 FINDINGS: The patient was found to have pneumomediastinum on the earlier CT scan. There is minimal pneumomediastinum noted along the left border and left hilar region. On the lateral view pneumomediastinum can be seen in the upper retrosternal region. The mediastinum is not abnormally widened. There is no pneumothorax nor pneumopericardium nor pleural effusion. The heart and pulmonary vascularity are normal. The bony thorax exhibits no acute abnormality. IMPRESSION: Small amounts of mediastinal air are observed. There is no mediastinal shift. There is no pneumothorax or pleural effusion. There is no mediastinal widening. Electronically Signed   By: David  [place] M.D.   On: 01/04/2018 13:09   Ct Chest W Contrast  Result Date: 01/04/2018 CLINICAL DATA:  22 year old female with neck  pain radiating down to the chest. Rule out esophageal leak. EXAM: CT CHEST WITH CONTRAST TECHNIQUE: Multidetector CT imaging of the chest was performed during intravenous contrast administration. CONTRAST:  75mL OMNIPAQUE IOHEXOL 300 MG/ML  SOLN COMPARISON:  None. FINDINGS: Cardiovascular: There is no cardiomegaly or pericardial effusion. The thoracic aorta is unremarkable. The visualized origins of the great vessels of the aortic arch are patent. The central pulmonary arteries appear unremarkable. Mediastinum/Nodes: There is no hilar or mediastinal adenopathy. There is extensive pneumomediastinum dissecting the fascial plane and extending up into the neck. As per instruction patient drank oral contrast during imaging. There is oral contrast in the mid and distal esophagus. No contrast identified outside of the confines of the esophageal wall. Esophagus appears grossly unremarkable. No thyroid nodules. Lungs/Pleura: The lungs are clear. There is no pleural effusion. Minimal amount of air noted along the right posterior pleural surface, extending from the pneumomediastinum. Upper Abdomen: No acute abnormality. Musculoskeletal: No chest wall abnormality. No acute or significant osseous findings. IMPRESSION: Extensive pneumomediastinum of indeterminate etiology. No extraluminal extension of oral contrast. Clinical correlation is recommended. These results were called by telephone at the time of interpretation on 01/04/2018 at 3:50 am to Dr. Zadie Rhine , who verbally acknowledged these results. Electronically Signed   By: Elgie Collard M.D.   On: 01/04/2018  03:54        Scheduled Meds: Continuous Infusions:   LOS: 0 days        Marlin Canary DO [facility]   If 7PM-7AM, please contact night-coverage [email or web address] Password [facility] 01/04/2018, 2:57 PM

## 2018-01-04 NOTE — Progress Notes (Signed)
Patient arrived to unit from ED. Report received from [place], [place]. Patient alert and oriented x4. Mom at bedside.

## 2018-01-04 NOTE — H&P (Signed)
History and Physical    Summer Pruitt:[id number] DOB: 24-Aug-1995 DOA: 01/04/2018  PCP: [facility]   Patient coming from: Home, by way of [facility]   Chief Complaint: Anterior neck pain   HPI: Summer Pruitt is a 22 y.o. female with medical history significant for depression, anxiety, and ADHD, now presenting to the emergency department for evaluation of anterior neck pain.  Patient reports that she has been suffering from some seasonal allergies with sneezing, but was otherwise in her usual state of health when she developed acute onset of pain at the anterior neck at approximately 3 PM on 01/03/2018.  There was no preceding trauma, coughing, or vomiting, and the patient denies any smoking or drug use.  She went to an urgent care for evaluation and was directed to the ED.  She proceeded to [facility] where she had a CT performed that was concerning for a possible pneumothorax and possible esophageal perforation.  ED physician consulted with cardiothoracic surgeon here at [facility] who recommended transfer to [facility] emergency department for further evaluation of this.  Patient also had basic blood work performed in [place], including a normal CBC and normal chemistry panel.  ED Course: Upon arrival to the ED, patient is found to be saturating well on room air and with vitals otherwise stable.  CT chest with IV contrast was performed and oral contrast was administered during the study; findings are consistent with extensive pneumomediastinum of indeterminate origin, normal-appearing esophagus, and no extraluminal extension of oral contrast.  Patient was given IV fluids and analgesia in the ED, has remained hemodynamically stable, in no apparent respiratory distress, and will be observed for ongoing evaluation and management of spontaneous pneumomediastinum.  Review of Systems:  All other systems reviewed and apart  from HPI, are negative.  Past Medical History:  Diagnosis Date  . Breast discharge    bilateral, spontaneous/with compression, clear-milky   . Pelvic pain   . PTSD (post-traumatic stress disorder)    brother committed suicide  . Wears glasses     Past Surgical History:  Procedure Laterality Date  . LAPAROSCOPY N/A 07/04/2016   Procedure: LAPAROSCOPY DIAGNOSTIC possible fulgeration of endometriosis;  Surgeon: Zelphia Cairo, MD;  Location: [facility];  Service: Gynecology;  Laterality: N/A;  . TYMPANOSTOMY TUBE PLACEMENT Bilateral child     reports that she has never smoked. She has never used smokeless tobacco. She reports that she does not drink alcohol or use drugs.  Allergies  Allergen Reactions  . Amoxicillin Shortness Of Breath and Rash  . Doxycycline Shortness Of Breath and Rash  . Zithromax [Azithromycin] Shortness Of Breath and Rash    Family History  Problem Relation Age of Onset  . Suicidality Brother      Prior to Admission medications   Medication Sig Start Date End Date Taking? Authorizing Provider  gabapentin (NEURONTIN) 300 MG capsule Take 300 mg by mouth at bedtime.    [provider]  ibuprofen (ADVIL,MOTRIN) 600 MG tablet Take 1 tablet (600 mg total) by mouth every 6 (six) hours as needed. 07/04/16   Zelphia Cairo, MD  lurasidone (LATUDA) 40 MG TABS tablet Take 40 mg by mouth every evening.    [provider]  norethindrone-ethinyl estradiol 1/35 (ORTHO-NOVUM, NORTREL,CYCLAFEM) tablet Take 1 tablet by mouth every evening.    [provider]  oxyCODONE-acetaminophen (ROXICET) 5-325 MG tablet Take 1-2 tablets by mouth every 4 (four) hours as needed  for severe pain. 07/04/16   Zelphia Cairo, MD  prazosin (MINIPRESS) 1 MG capsule Take 1 mg by mouth at bedtime.    [provider]    Physical Exam: Vitals:   01/04/18 0300 01/04/18 0315 01/04/18 0430 01/04/18 0445  BP: (!) 101/56 (!) 94/51 (!) 95/56 (!)  95/57  Pulse: 78 88 76 77  Resp: (!) 21 20 14 14   SpO2: 98% 99% 98% 99%  Weight:      Height:         Constitutional: NAD, calm  Eyes: PERTLA, lids and conjunctivae normal ENMT: Mucous membranes are moist. Posterior pharynx clear of any exudate or lesions.   Neck: normal, supple, no masses, no thyromegaly Respiratory: clear to auscultation bilaterally, no wheezing, no crackles. Normal respiratory effort.    Cardiovascular: S1 & S2 heard, regular rate and rhythm. No extremity edema.   Abdomen: No distension, no tenderness, soft. Bowel sounds normal.  Musculoskeletal: no clubbing / cyanosis. No joint deformity upper and lower extremities.   Skin: no significant rashes, lesions, ulcers. Warm, dry, well-perfused. Neurologic: CN 2-12 grossly intact. Sensation intact. Strength 5/5 in all 4 limbs.  Psychiatric: Alert and oriented x 3. Calm, cooperative.    Labs on Admission: I have personally reviewed following labs and imaging studies  CBC: Recent Labs  Lab 01/04/18 0226  HGB 11.6*  HCT 34.0*   Basic Metabolic Panel: Recent Labs  Lab 01/04/18 0226  NA 138  K 3.5  CL 102  GLUCOSE 93  BUN 8  CREATININE 0.70   GFR: Estimated Creatinine Clearance: 100.1 mL/min (by C-G formula based on SCr of 0.7 mg/dL). Liver Function Tests: No results for input(s): AST, ALT, ALKPHOS, BILITOT, PROT, ALBUMIN in the last 168 hours. No results for input(s): LIPASE, AMYLASE in the last 168 hours. No results for input(s): AMMONIA in the last 168 hours. Coagulation Profile: No results for input(s): INR, PROTIME in the last 168 hours. Cardiac Enzymes: No results for input(s): CKTOTAL, CKMB, CKMBINDEX, TROPONINI in the last 168 hours. BNP (last 3 results) No results for input(s): PROBNP in the last 8760 hours. HbA1C: No results for input(s): HGBA1C in the last 72 hours. CBG: No results for input(s): GLUCAP in the last 168 hours. Lipid Profile: No results for input(s): CHOL, HDL, LDLCALC,  TRIG, CHOLHDL, LDLDIRECT in the last 72 hours. Thyroid Function Tests: No results for input(s): TSH, T4TOTAL, FREET4, T3FREE, THYROIDAB in the last 72 hours. Anemia Panel: No results for input(s): VITAMINB12, FOLATE, FERRITIN, TIBC, IRON, RETICCTPCT in the last 72 hours. Urine analysis:    Component Value Date/Time   COLORURINE YELLOW 09/01/2008 1724   APPEARANCEUR CLEAR 09/01/2008 1724   LABSPEC 1.009 09/01/2008 1724   PHURINE 6.5 09/01/2008 1724   GLUCOSEU NEGATIVE 09/01/2008 1724   HGBUR NEGATIVE 09/01/2008 1724   BILIRUBINUR NEGATIVE 09/01/2008 1724   KETONESUR NEGATIVE 09/01/2008 1724   PROTEINUR NEGATIVE 09/01/2008 1724   UROBILINOGEN 0.2 09/01/2008 1724   NITRITE NEGATIVE 09/01/2008 1724   LEUKOCYTESUR TRACE (A) 09/01/2008 1724   Sepsis Labs: @LABRCNTIP (procalcitonin:4,lacticidven:4) )No results found for this or any previous visit (from the past 240 hour(s)).   Radiological Exams on Admission: Ct Chest W Contrast  Result Date: 01/04/2018 CLINICAL DATA:  22 year old female with neck pain radiating down to the chest. Rule out esophageal leak. EXAM: CT CHEST WITH CONTRAST TECHNIQUE: Multidetector CT imaging of the chest was performed during intravenous contrast administration. CONTRAST:  75mL OMNIPAQUE IOHEXOL 300 MG/ML  SOLN COMPARISON:  None. FINDINGS: Cardiovascular: There  is no cardiomegaly or pericardial effusion. The thoracic aorta is unremarkable. The visualized origins of the great vessels of the aortic arch are patent. The central pulmonary arteries appear unremarkable. Mediastinum/Nodes: There is no hilar or mediastinal adenopathy. There is extensive pneumomediastinum dissecting the fascial plane and extending up into the neck. As per instruction patient drank oral contrast during imaging. There is oral contrast in the mid and distal esophagus. No contrast identified outside of the confines of the esophageal wall. Esophagus appears grossly unremarkable. No thyroid nodules.  Lungs/Pleura: The lungs are clear. There is no pleural effusion. Minimal amount of air noted along the right posterior pleural surface, extending from the pneumomediastinum. Upper Abdomen: No acute abnormality. Musculoskeletal: No chest wall abnormality. No acute or significant osseous findings. IMPRESSION: Extensive pneumomediastinum of indeterminate etiology. No extraluminal extension of oral contrast. Clinical correlation is recommended. These results were called by telephone at the time of interpretation on 01/04/2018 at 3:50 am to Dr. Zadie Rhine , who verbally acknowledged these results. Electronically Signed   By: Elgie Collard M.D.   On: 01/04/2018 03:54    EKG: Normal sinus rhythm per documentation from [facility]   Assessment/Plan   1. Spontaneous pneumomediastinum  - Patient presented to outside facility with acute-onset of anterior neck pain, had CT concerning for possible esophageal perforation  - CT chest was performed here with IV contrast and oral contrast swallowed during study, revealing pneumomediastinum with normal appearance to esophagus and without extraluminal extension of oral contrast or pneumothorax - She reports recent seasonal allergy with sneezing, but denies coughing, vomiting, bearing down, trauma, or drug use  - She was treated with analgesia in ED and has remained stable  - Continue conservative management with as needed analgesics, rest, and avoidance of cough or valsalva     DVT prophylaxis: SCD's  Code Status: Full  Family Communication: Discussed with patient  Consults called: CT surgery consulted by ED physician  Admission status: Observation     Briscoe Deutscher, MD [facility] Pager [phone]  If 7PM-7AM, please contact night-coverage [email or web address] Password [email or web address]  01/04/2018, 5:11 AM

## 2018-01-05 ENCOUNTER — Observation Stay (HOSPITAL_COMMUNITY): Payer: BLUE CROSS/BLUE SHIELD

## 2018-01-05 ENCOUNTER — Encounter (HOSPITAL_COMMUNITY): Payer: Self-pay

## 2018-01-05 DIAGNOSIS — J982 Interstitial emphysema: Secondary | ICD-10-CM | POA: Diagnosis not present

## 2018-01-05 MED ORDER — KETOROLAC TROMETHAMINE 30 MG/ML IJ SOLN
30.0000 mg | Freq: Once | INTRAMUSCULAR | Status: AC
  Administered 2018-01-05: 30 mg via INTRAVENOUS
  Filled 2018-01-05: qty 1

## 2018-01-05 MED ORDER — BISACODYL 5 MG PO TBEC: 5.0000 mg | DELAYED_RELEASE_TABLET | Freq: Every day | ORAL | 0 refills | Status: DC | PRN

## 2018-01-05 MED ORDER — POLYETHYLENE GLYCOL 3350 17 G PO PACK: 17.0000 g | PACK | Freq: Every day | ORAL | 0 refills | Status: DC | PRN

## 2018-01-05 MED ORDER — POLYETHYLENE GLYCOL 3350 17 G PO PACK: 17.0000 g | PACK | Freq: Every day | ORAL | Status: DC | PRN

## 2018-01-05 MED ORDER — OXYCODONE-ACETAMINOPHEN 5-325 MG PO TABS: 1.0000 | ORAL_TABLET | ORAL | 0 refills | Status: DC | PRN

## 2018-01-05 NOTE — Consult Note (Signed)
[address]       [place] [address]             [phone]        Summer Pruitt [facility] Medical Record #[id number] Date of Birth: 1996-03-02  Referring: Dr Marlin Canary Primary Care: [facility] Primary Cardiologist:No primary care provider on file.  Chief Complaint:    Pneumomediastinum   History of Present Illness:      [name] is a 22 year old female patient with a past medical history significant for depression, anxiety, vaping, and ADHD who presented to the emergency department for evaluation of anterior neck pain.  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 She does not remember if she vaped that morning or not.  There was no preceding coughing, vomiting, or trauma.  She went immediately to the urgent care for evaluation and was directed towards the emergency department.  She presented to [facility] where she had a CT performed which was concerning for a possible pneumothorax or possible esophageal perforation.  She was transferred to [facility] emergency department for further evaluation.  Yesterday, she underwent a CT of the chest which showed no contrast identified outside the confines of the esophagus wall.  There was an extensive pneumomediastinum dissecting the fascial plane and extending up into the neck.  On chest x-ray today it showed a mild pneumomediastinum which was improved from prior exams. She does currently still have some pain with swallowing. She states that it feels "tight". We are consulted for possible surgical intervention. No history of PTX in past or asthma.   Current Activity/ Functional Status: Patient was independent with mobility/ambulation, transfers, ADL's, IADL's.   Zubrod Score: At the time of surgery this  patient's most appropriate activity status/level should be described as: []     0    Normal activity, no symptoms [x]     1    Restricted in physical strenuous activity but ambulatory, able to do out light work []     2    Ambulatory and capable of self care, unable to do work activities, up and about                 more than 50%  Of the time                            []     3    Only limited self care, in bed greater than 50% of waking hours []     4    Completely disabled, no self care, confined to bed or chair []     5    Moribund  Past Medical History:  Diagnosis Date  . Breast discharge    bilateral, spontaneous/with compression, clear-milky   . Complication of anesthesia   . Depression   . Endometriosis   . Pelvic pain   . PONV (postoperative nausea and vomiting)   . PTSD (post-traumatic stress disorder)    brother committed suicide  . Wears glasses     Past Surgical History:  Procedure Laterality Date  . LAPAROSCOPY N/A 07/04/2016   Procedure: LAPAROSCOPY DIAGNOSTIC possible fulgeration of endometriosis;  Surgeon: Zelphia Cairo, MD;  Location: [facility];  Service: Gynecology;  Laterality: N/A;  .  TONSILLECTOMY    . TYMPANOSTOMY TUBE PLACEMENT Bilateral child    Social History   Tobacco Use  Smoking Status Never Smoker  Smokeless Tobacco Never Used    Social History   Substance and Sexual Activity  Alcohol Use No     Allergies  Allergen Reactions  . Amoxicillin Shortness Of Breath and Rash    Has patient had a PCN reaction causing immediate rash, facial/tongue/throat swelling, SOB or lightheadedness with hypotension: Yes Has patient had a PCN reaction causing severe rash involving mucus membranes or skin necrosis: No Has patient had a PCN reaction that required hospitalization: No Has patient had a PCN reaction occurring within the last 10 years: Yes If all of the above answers are "NO", then may proceed with Cephalosporin use.   Marland Kitchen Doxycycline  Shortness Of Breath and Rash  . Zithromax [Azithromycin] Shortness Of Breath and Rash  . Cefdinir Rash    Other reaction(s): Other (See Comments) Myalgias Myalgias Myalgias     Current Facility-Administered Medications  Medication Dose Route Frequency Provider Last Rate Last Dose  . acetaminophen (TYLENOL) tablet 650 mg  650 mg Oral Q6H PRN Opyd, Lavone Neri, MD       Or  . acetaminophen (TYLENOL) suppository 650 mg  650 mg Rectal Q6H PRN Opyd, Lavone Neri, MD      . ALPRAZolam Prudy Feeler) tablet 0.5 mg  0.5 mg Oral BID PRN Marcos Eke, PA-C   0.5 mg at 01/04/18 2139  . bisacodyl (DULCOLAX) EC tablet 5 mg  5 mg Oral Daily PRN Opyd, Lavone Neri, MD      . diphenhydrAMINE (BENADRYL) capsule 25 mg  25 mg Oral Q6H PRN Opyd, Lavone Neri, MD      . guaiFENesin-dextromethorphan (ROBITUSSIN DM) 100-10 MG/5ML syrup 5 mL  5 mL Oral Q4H PRN Opyd, Lavone Neri, MD      . ibuprofen (ADVIL,MOTRIN) tablet 600 mg  600 mg Oral Q6H PRN Opyd, Lavone Neri, MD      . magnesium citrate solution 1 Bottle  1 Bottle Oral Once PRN Opyd, Lavone Neri, MD      . ondansetron (ZOFRAN) tablet 4 mg  4 mg Oral Q6H PRN Opyd, Lavone Neri, MD       Or  . ondansetron (ZOFRAN) injection 4 mg  4 mg Intravenous Q6H PRN Opyd, Lavone Neri, MD      . oxyCODONE-acetaminophen (PERCOCET/ROXICET) 5-325 MG per tablet 1 tablet  1 tablet Oral Q4H PRN Marcos Eke, PA-C   1 tablet at 01/05/18 0442  . polyethylene glycol (MIRALAX / GLYCOLAX) packet 17 g  17 g Oral Daily PRN Vann, Jessica U, DO      . senna-docusate (Senokot-S) tablet 1 tablet  1 tablet Oral QHS PRN Opyd, Lavone Neri, MD      . sertraline (ZOLOFT) tablet 150 mg  150 mg Oral QHS Marcos Eke, PA-C   150 mg at 01/04/18 2139    Medications Prior to Admission  Medication Sig Dispense Refill Last Dose  . ALPRAZolam (XANAX) 0.5 MG tablet Take 0.5 mg by mouth 2 (two) times daily as needed for anxiety.   01/02/2018  . [START ON 02/17/2018] amphetamine-dextroamphetamine (ADDERALL) 10 MG tablet  Take 10 mg by mouth 2 (two) times daily.   01/01/2018  . EPINEPHrine 0.3 mg/0.3 mL IJ SOAJ injection Inject 0.3 mg into the muscle once.   unk  . medroxyPROGESTERone (DEPO-PROVERA) 150 MG/ML injection Inject 150 mg into the muscle every 3 (three) months.  09/2017  . sertraline (ZOLOFT) 100 MG tablet Take 150 mg by mouth at bedtime.   3 01/02/2018  . ibuprofen (ADVIL,MOTRIN) 600 MG tablet Take 1 tablet (600 mg total) by mouth every 6 (six) hours as needed. (Patient not taking: Reported on 01/04/2018) 30 tablet 1 Not Taking at Unknown time  . oxyCODONE-acetaminophen (ROXICET) 5-325 MG tablet Take 1-2 tablets by mouth every 4 (four) hours as needed for severe pain. (Patient not taking: Reported on 01/04/2018) 20 tablet 0 Not Taking at Unknown time    Family History  Problem Relation Age of Onset  . Suicidality Brother      Review of Systems:   Review of Systems  Constitutional: Negative for chills, fever and malaise/fatigue.  Eyes: Negative.   Respiratory: Negative for cough, sputum production and shortness of breath.   Cardiovascular: Negative for chest pain and leg swelling.  Gastrointestinal: Negative for heartburn, nausea and vomiting.  Neurological: Negative.   Psychiatric/Behavioral: Positive for depression. The patient is nervous/anxious.    Pertinent items are noted in HPI.    Physical Exam: BP (!) 91/48 (BP Location: Right Arm)   Pulse 66   Temp 98.3 F (36.8 C) (Oral)   Resp 16   Ht 5\' 5"  (1.651 m)   Wt 66.2 kg   SpO2 99%   BMI 24.30 kg/m    General appearance: alert, cooperative and no distress Resp: clear to auscultation bilaterally Cardio: regular rate and rhythm, S1, S2 normal, no murmur, click, rub or gallop Extremities: extremities normal, atraumatic, no cyanosis or edema Neurologic: Grossly normal  Diagnostic Studies & Laboratory data:     Recent Radiology Findings:   Dg Chest 2 View  Result Date: 01/05/2018 CLINICAL DATA:  Follow-up mediastinal air EXAM:  CHEST - 2 VIEW COMPARISON:  01/04/2018 FINDINGS: Cardiac shadow is within normal limits. The degree of mediastinal air is slightly less than that seen on the prior exam. No pneumothorax is noted. No focal infiltrate or effusion is seen. No bony abnormality is noted. IMPRESSION: Mild pneumomediastinum although improved from the prior exams. Electronically Signed   By: Alcide Clever M.D.   On: 01/05/2018 10:35   Dg Chest 2 View  Result Date: 01/04/2018 CLINICAL DATA:  Neck and mid sternal chest pain beginning yesterday associated with difficulty swallowing or breathing in deeply. Pain extends from the neck into the mid sternal region. EXAM: CHEST - 2 VIEW COMPARISON:  CT scan of the chest of January 04, 2018 FINDINGS: The patient was found to have pneumomediastinum on the earlier CT scan. There is minimal pneumomediastinum noted along the left border and left hilar region. On the lateral view pneumomediastinum can be seen in the upper retrosternal region. The mediastinum is not abnormally widened. There is no pneumothorax nor pneumopericardium nor pleural effusion. The heart and pulmonary vascularity are normal. The bony thorax exhibits no acute abnormality. IMPRESSION: Small amounts of mediastinal air are observed. There is no mediastinal shift. There is no pneumothorax or pleural effusion. There is no mediastinal widening. Electronically Signed   By: David  [place] M.D.   On: 01/04/2018 13:09   Ct Chest W Contrast  Result Date: 01/04/2018 CLINICAL DATA:  22 year old female with neck pain radiating down to the chest. Rule out esophageal leak. EXAM: CT CHEST WITH CONTRAST TECHNIQUE: Multidetector CT imaging of the chest was performed during intravenous contrast administration. CONTRAST:  75mL OMNIPAQUE IOHEXOL 300 MG/ML  SOLN COMPARISON:  None. FINDINGS: Cardiovascular: There is no cardiomegaly or pericardial effusion. The thoracic aorta is  unremarkable. The visualized origins of the great vessels of the  aortic arch are patent. The central pulmonary arteries appear unremarkable. Mediastinum/Nodes: There is no hilar or mediastinal adenopathy. There is extensive pneumomediastinum dissecting the fascial plane and extending up into the neck. As per instruction patient drank oral contrast during imaging. There is oral contrast in the mid and distal esophagus. No contrast identified outside of the confines of the esophageal wall. Esophagus appears grossly unremarkable. No thyroid nodules. Lungs/Pleura: The lungs are clear. There is no pleural effusion. Minimal amount of air noted along the right posterior pleural surface, extending from the pneumomediastinum. Upper Abdomen: No acute abnormality. Musculoskeletal: No chest wall abnormality. No acute or significant osseous findings. IMPRESSION: Extensive pneumomediastinum of indeterminate etiology. No extraluminal extension of oral contrast. Clinical correlation is recommended. These results were called by telephone at the time of interpretation on 01/04/2018 at 3:50 am to Dr. Zadie Rhine , who verbally acknowledged these results. Electronically Signed   By: Elgie Collard M.D.   On: 01/04/2018 03:54     I have independently reviewed the above radiologic studies and discussed with the patient   Recent Lab Findings: Lab Results  Component Value Date   WBC 4.7 06/30/2016   HGB 11.6 (L) 01/04/2018   HCT 34.0 (L) 01/04/2018   PLT 197 06/30/2016   GLUCOSE 93 01/04/2018   ALT 13 09/01/2008   AST 23 09/01/2008   NA 138 01/04/2018   K 3.5 01/04/2018   CL 102 01/04/2018   CREATININE 0.70 01/04/2018   BUN 8 01/04/2018   CO2 25 06/30/2016      Assessment / Plan:      1. Spontaneous Pneumomediastinum-Likely from vaping. CT of the chest listed above. On CXR today there was some resolution. She is still having difficultly swallowing but it has improved. She is tolerating room air without issue. Vaping Cessation encouraged.  2. Depression/Anxiety-she is on  her home medications 3. ADHD- continue Adderal   Plan:.  Continue conservative therapy.  Could be managed from home . Follow up with primary care    Jari Favre, PA-C 01/05/2018 10:42 AM  I have seen patient , examined her and discussed with her and her parents the findings and that  there is no  Indication for  Surgery intervention or chest tube  at this time for spontaneous pneumomediastinum There is no indication of esophageal perforation  Delight Ovens MD      [address] [place] [place] [address] Office [phone]   Beeper [phone]

## 2018-01-05 NOTE — Progress Notes (Signed)
Continues to have pain with swallowing and deep breathing.  No worse than prior.  Will discuss with CVTS.  Vitals and O2 reassuring.  No crepitus on exam  Marlin Canary DO

## 2018-01-05 NOTE — Progress Notes (Signed)
Summer Pruitt to be D/C'd  per MD order. Discussed with the patient and all questions fully answered.  VSS, Skin clean, dry and intact without evidence of skin break down, no evidence of skin tears noted.  IV catheter discontinued intact. Site without signs and symptoms of complications. Dressing and pressure applied.  An After Visit Summary was printed and given to the patient. Patient received prescription.  D/c education completed with patient/family including follow up instructions, medication list, d/c activities limitations if indicated, with other d/c instructions as indicated by MD - patient able to verbalize understanding, all questions fully answered.   Patient instructed to return to ED, call 911, or call MD for any changes in condition.   Patient to be escorted via WC, and D/C home via private auto.

## 2018-01-05 NOTE — Discharge Summary (Signed)
Physician Discharge Summary  Summer Pruitt:[id number] DOB: 02-15-1996 DOA: 01/04/2018  PCP: [facility]  Admit date: 01/04/2018 Discharge date: 01/05/2018  Admitted From: home Discharge disposition: home   Recommendations for Outpatient Follow-Up:   1. No vaping 2. Lifting restrictions given 3. Follow up with PCP w/in 1 week   Discharge Diagnosis:   Principal Problem:   Pneumomediastinum (HCC) Active Problems:   ADHD, predominantly inattentive type   Depression   GAD (generalized anxiety disorder)    Discharge Condition: Improved.  Diet recommendation:   Regular.  Wound care: None.  Code status: Full.   History of Present Illness:   Summer Pruitt is a 22 y.o. female with medical history significant for depression, anxiety, and ADHD, now presenting to the emergency department for evaluation of anterior neck pain.  Patient reports that she has been suffering from some seasonal allergies with sneezing, but was otherwise in her usual state of health when she developed acute onset of pain at the anterior neck at approximately 3 PM on 01/03/2018.  There was no preceding trauma, coughing, or vomiting, and the patient denies any smoking or drug use.  She went to an urgent care for evaluation and was directed to the ED.  She proceeded to [facility] where she had a CT performed that was concerning for a possible pneumothorax and possible esophageal perforation.  ED physician consulted with cardiothoracic surgeon here at [facility] who recommended transfer to [facility] emergency department for further evaluation of this.  Patient also had basic blood work performed in [place], including a normal CBC and normal chemistry panel.   Hospital Course by Problem:   Spontaneous pneumomediastinum, likely due to Vaping. CXR  does not reveal new findings, improving pneumomediastinum Instructed to discontinue vaping    Refrain from coughing or valsalva until symptoms resolve.  Continue analgesics and home oxycodone with bowel regimen Seen by CVTS  ADHD Continue Adderal  Depression Continue Zoloft    Medical Consultants:   CVTS  Discharge Exam:   Vitals:   01/05/18 0432 01/05/18 1405  BP: (!) 91/48 101/65  Pulse: 66 66  Resp: 16   Temp: 98.3 F (36.8 C) 98.3 F (36.8 C)  SpO2: 99% 99%   Vitals:   01/04/18 1351 01/04/18 2059 01/05/18 0432 01/05/18 1405  BP: (!) 102/59 112/65 (!) 91/48 101/65  Pulse: 78 76 66 66  Resp: 14 18 16    Temp: 97.9 F (36.6 C) 98.3 F (36.8 C) 98.3 F (36.8 C) 98.3 F (36.8 C)  TempSrc: Oral Oral Oral Oral  SpO2: 100% 100% 99% 99%  Weight:      Height:        General exam: Appears calm and comfortable.   The results of significant diagnostics from this hospitalization (including imaging, microbiology, ancillary and laboratory) are listed below for reference.     Procedures and Diagnostic Studies:   Dg Chest 2 View  Result Date: 01/05/2018 CLINICAL DATA:  Follow-up mediastinal air EXAM: CHEST - 2 VIEW COMPARISON:  01/04/2018 FINDINGS: Cardiac shadow is within normal limits. The degree of mediastinal air is slightly less than that seen on the prior exam. No pneumothorax is noted. No focal infiltrate or effusion is seen. No bony abnormality is noted. IMPRESSION: Mild pneumomediastinum although improved from the prior exams. Electronically Signed   By: Alcide Clever M.D.   On: 01/05/2018 10:35   Dg Chest 2 View  Result Date: 01/04/2018 CLINICAL DATA:  Neck and mid sternal chest pain beginning yesterday associated with difficulty swallowing or breathing in deeply. Pain extends from the neck into the mid sternal region. EXAM: CHEST - 2 VIEW COMPARISON:  CT scan of the chest of January 04, 2018 FINDINGS: The patient was found to have pneumomediastinum on the earlier CT scan. There is minimal pneumomediastinum noted along the left border and left hilar  region. On the lateral view pneumomediastinum can be seen in the upper retrosternal region. The mediastinum is not abnormally widened. There is no pneumothorax nor pneumopericardium nor pleural effusion. The heart and pulmonary vascularity are normal. The bony thorax exhibits no acute abnormality. IMPRESSION: Small amounts of mediastinal air are observed. There is no mediastinal shift. There is no pneumothorax or pleural effusion. There is no mediastinal widening. Electronically Signed   By: David  [place] M.D.   On: 01/04/2018 13:09   Ct Chest W Contrast  Result Date: 01/04/2018 CLINICAL DATA:  22 year old female with neck pain radiating down to the chest. Rule out esophageal leak. EXAM: CT CHEST WITH CONTRAST TECHNIQUE: Multidetector CT imaging of the chest was performed during intravenous contrast administration. CONTRAST:  75mL OMNIPAQUE IOHEXOL 300 MG/ML  SOLN COMPARISON:  None. FINDINGS: Cardiovascular: There is no cardiomegaly or pericardial effusion. The thoracic aorta is unremarkable. The visualized origins of the great vessels of the aortic arch are patent. The central pulmonary arteries appear unremarkable. Mediastinum/Nodes: There is no hilar or mediastinal adenopathy. There is extensive pneumomediastinum dissecting the fascial plane and extending up into the neck. As per instruction patient drank oral contrast during imaging. There is oral contrast in the mid and distal esophagus. No contrast identified outside of the confines of the esophageal wall. Esophagus appears grossly unremarkable. No thyroid nodules. Lungs/Pleura: The lungs are clear. There is no pleural effusion. Minimal amount of air noted along the right posterior pleural surface, extending from the pneumomediastinum. Upper Abdomen: No acute abnormality. Musculoskeletal: No chest wall abnormality. No acute or significant osseous findings. IMPRESSION: Extensive pneumomediastinum of indeterminate etiology. No extraluminal extension of oral  contrast. Clinical correlation is recommended. These results were called by telephone at the time of interpretation on 01/04/2018 at 3:50 am to Dr. Zadie Rhine , who verbally acknowledged these results. Electronically Signed   By: Elgie Collard M.D.   On: 01/04/2018 03:54     Labs:   Basic Metabolic Panel: Recent Labs  Lab 01/04/18 0226  NA 138  K 3.5  CL 102  GLUCOSE 93  BUN 8  CREATININE 0.70   GFR Estimated Creatinine Clearance: 100.1 mL/min (by C-G formula based on SCr of 0.7 mg/dL). Liver Function Tests: No results for input(s): AST, ALT, ALKPHOS, BILITOT, PROT, ALBUMIN in the last 168 hours. No results for input(s): LIPASE, AMYLASE in the last 168 hours. No results for input(s): AMMONIA in the last 168 hours. Coagulation profile No results for input(s): INR, PROTIME in the last 168 hours.  CBC: Recent Labs  Lab 01/04/18 0226  HGB 11.6*  HCT 34.0*   Cardiac Enzymes: No results for input(s): CKTOTAL, CKMB, CKMBINDEX, TROPONINI in the last 168 hours. BNP: Invalid input(s): POCBNP CBG: No results for input(s): GLUCAP in the last 168 hours. D-Dimer No results for input(s): DDIMER in the last 72 hours. Hgb A1c No results for input(s): HGBA1C in the last 72 hours. Lipid Profile No results for input(s): CHOL, HDL, LDLCALC, TRIG, CHOLHDL, LDLDIRECT in the last 72 hours. Thyroid function studies No results for input(s): TSH, T4TOTAL, T3FREE, THYROIDAB in the last  72 hours.  Invalid input(s): FREET3 Anemia work up No results for input(s): VITAMINB12, FOLATE, FERRITIN, TIBC, IRON, RETICCTPCT in the last 72 hours. Microbiology No results found for this or any previous visit (from the past 240 hour(s)).   Discharge Instructions:   Discharge Instructions    Call MD for:  severe uncontrolled pain   Complete by:  As directed    Discharge instructions   Complete by:  As directed    Avoid coughing forcefully, may take Robitussin over the counter for cough as  needed, until symptoms resolved, and oK with your primary doctor Avoid constipation (Valsalva) until pneumomediastinum resolves Discontinue the use of Vaping, there is supporting literature that there is a relationship between vaping and pneumomediastinum Follow up with your primary doctor early next week for post hospital visit with chest x ray   Increase activity slowly   Complete by:  As directed      Allergies as of 01/05/2018      Reactions   Amoxicillin Shortness Of Breath, Rash   Has patient had a PCN reaction causing immediate rash, facial/tongue/throat swelling, SOB or lightheadedness with hypotension: Yes Has patient had a PCN reaction causing severe rash involving mucus membranes or skin necrosis: No Has patient had a PCN reaction that required hospitalization: No Has patient had a PCN reaction occurring within the last 10 years: Yes If all of the above answers are "NO", then may proceed with Cephalosporin use.   Doxycycline Shortness Of Breath, Rash   Zithromax [azithromycin] Shortness Of Breath, Rash   Cefdinir Rash   Other reaction(s): Other (See Comments) Myalgias Myalgias Myalgias      Medication List    STOP taking these medications   ibuprofen 600 MG tablet Commonly known as:  ADVIL,MOTRIN     TAKE these medications   ADDERALL 10 MG tablet Generic drug:  amphetamine-dextroamphetamine Take 10 mg by mouth 2 (two) times daily. Start taking on:  02/17/2018   ALPRAZolam 0.5 MG tablet Commonly known as:  XANAX Take 0.5 mg by mouth 2 (two) times daily as needed for anxiety.   bisacodyl 5 MG EC tablet Commonly known as:  DULCOLAX Take 1 tablet (5 mg total) by mouth daily as needed for moderate constipation.   EPINEPHrine 0.3 mg/0.3 mL Soaj injection Commonly known as:  EPI-PEN Inject 0.3 mg into the muscle once.   medroxyPROGESTERone 150 MG/ML injection Commonly known as:  DEPO-PROVERA Inject 150 mg into the muscle every 3 (three) months.     oxyCODONE-acetaminophen 5-325 MG tablet Commonly known as:  PERCOCET/ROXICET Take 1 tablet by mouth every 4 (four) hours as needed for moderate pain or severe pain. What changed:    how much to take  reasons to take this   polyethylene glycol packet Commonly known as:  MIRALAX / GLYCOLAX Take 17 g by mouth daily as needed for moderate constipation.   sertraline 100 MG tablet Commonly known as:  ZOLOFT Take 150 mg by mouth at bedtime.      Follow-up Information    [place] Follow up in 5 day(s).   Specialty:  Family Medicine Why:  Follow up with PCP this week with labs including CBC and BMET and CXR and to discuss Vaping use   Contact information: [address] [place] [address] [phone]            Time coordinating discharge: 25 min  Signed:  Joseph Art  [facility] 01/05/2018, 3:34 PM

## 2018-01-05 NOTE — Plan of Care (Signed)

## 2018-01-10 ENCOUNTER — Ambulatory Visit
Admission: RE | Admit: 2018-01-10 | Discharge: 2018-01-10 | Disposition: A | Discharge status: Home / Self Care | Payer: BLUE CROSS/BLUE SHIELD | Source: Ambulatory Visit | Attending: Family Medicine | Admitting: Family Medicine

## 2018-01-10 ENCOUNTER — Other Ambulatory Visit: Payer: Self-pay | Admitting: Family Medicine

## 2018-01-10 DIAGNOSIS — J982 Interstitial emphysema: Secondary | ICD-10-CM

## 2019-04-14 ENCOUNTER — Other Ambulatory Visit: Payer: Self-pay

## 2019-04-14 ENCOUNTER — Emergency Department (HOSPITAL_COMMUNITY): Payer: BC Managed Care – PPO

## 2019-04-14 ENCOUNTER — Emergency Department (HOSPITAL_COMMUNITY)
Admission: EM | Admit: 2019-04-14 | Discharge: 2019-04-14 | Disposition: A | Discharge status: Home / Self Care | Payer: BC Managed Care – PPO | Attending: Emergency Medicine | Admitting: Emergency Medicine

## 2019-04-14 ENCOUNTER — Encounter (HOSPITAL_COMMUNITY): Payer: Self-pay | Admitting: Emergency Medicine

## 2019-04-14 DIAGNOSIS — N739 Female pelvic inflammatory disease, unspecified: Secondary | ICD-10-CM | POA: Insufficient documentation

## 2019-04-14 DIAGNOSIS — R102 Pelvic and perineal pain: Secondary | ICD-10-CM | POA: Diagnosis present

## 2019-04-14 DIAGNOSIS — N730 Acute parametritis and pelvic cellulitis: Secondary | ICD-10-CM

## 2019-04-14 DIAGNOSIS — F909 Attention-deficit hyperactivity disorder, unspecified type: Secondary | ICD-10-CM | POA: Insufficient documentation

## 2019-04-14 DIAGNOSIS — N941 Unspecified dyspareunia: Secondary | ICD-10-CM | POA: Insufficient documentation

## 2019-04-14 DIAGNOSIS — Z79899 Other long term (current) drug therapy: Secondary | ICD-10-CM | POA: Diagnosis not present

## 2019-04-14 LAB — COMPREHENSIVE METABOLIC PANEL
ALT: 16 U/L (ref 0–44)
AST: 16 U/L (ref 15–41)
Albumin: 4.4 g/dL (ref 3.5–5.0)
Alkaline Phosphatase: 51 U/L (ref 38–126)
Anion gap: 10 (ref 5–15)
BUN: 10 mg/dL (ref 6–20)
CO2: 22 mmol/L (ref 22–32)
Calcium: 9.7 mg/dL (ref 8.9–10.3)
Chloride: 107 mmol/L (ref 98–111)
Creatinine, Ser: 1.01 mg/dL — ABNORMAL HIGH (ref 0.44–1.00)
GFR calc Af Amer: >60 mL/min (ref >60)
GFR calc non Af Amer: >60 mL/min (ref >60)
Glucose, Bld: 94 mg/dL (ref 70–99)
Potassium: 4.1 mmol/L (ref 3.5–5.1)
Sodium: 139 mmol/L (ref 135–145)
Total Bilirubin: 1.3 mg/dL — ABNORMAL HIGH (ref 0.3–1.2)
Total Protein: 6.7 g/dL (ref 6.5–8.1)

## 2019-04-14 LAB — WET PREP, GENITAL
Clue Cells Wet Prep HPF POC: NONE SEEN
Sperm: NONE SEEN
Trich, Wet Prep: NONE SEEN

## 2019-04-14 LAB — CBC
HCT: 37.8 % (ref 36.0–46.0)
Hemoglobin: 12.9 g/dL (ref 12.0–15.0)
MCH: 31.1 pg (ref 26.0–34.0)
MCHC: 34.1 g/dL (ref 30.0–36.0)
MCV: 91.1 fL (ref 80.0–100.0)
Platelets: 213 10*3/uL (ref 150–400)
RBC: 4.15 MIL/uL (ref 3.87–5.11)
RDW: 11.9 % (ref 11.5–15.5)
WBC: 6.1 10*3/uL (ref 4.0–10.5)
nRBC: 0 % (ref 0.0–0.2)

## 2019-04-14 LAB — URINALYSIS, ROUTINE W REFLEX MICROSCOPIC
Bilirubin Urine: NEGATIVE
Glucose, UA: NEGATIVE mg/dL
Hgb urine dipstick: NEGATIVE
Ketones, ur: 20 mg/dL — AB
Leukocytes,Ua: NEGATIVE
Nitrite: NEGATIVE
Protein, ur: NEGATIVE mg/dL
Specific Gravity, Urine: 1.019 (ref 1.005–1.030)
pH: 6 (ref 5.0–8.0)

## 2019-04-14 LAB — LIPASE, BLOOD: Lipase: 28 U/L (ref 11–51)

## 2019-04-14 LAB — I-STAT BETA HCG BLOOD, ED (MC, WL, AP ONLY): I-stat hCG, quantitative: <5 m[IU]/mL (ref <5)

## 2019-04-14 MED ORDER — SODIUM CHLORIDE 0.9% FLUSH: 3.0000 mL | Freq: Once | INTRAVENOUS | Status: DC

## 2019-04-14 MED ORDER — MORPHINE SULFATE (PF) 4 MG/ML IV SOLN
4.0000 mg | Freq: Once | INTRAVENOUS | Status: AC
  Administered 2019-04-14: 4 mg via INTRAVENOUS
  Filled 2019-04-14: qty 1

## 2019-04-14 MED ORDER — LEVOFLOXACIN 500 MG PO TABS
500.0000 mg | ORAL_TABLET | Freq: Once | ORAL | Status: AC
  Administered 2019-04-14: 500 mg via ORAL
  Filled 2019-04-14 (×2): qty 1

## 2019-04-14 MED ORDER — LEVOFLOXACIN 500 MG PO TABS: 500.0000 mg | ORAL_TABLET | Freq: Every day | ORAL | 0 refills | Status: DC

## 2019-04-14 MED ORDER — METRONIDAZOLE 500 MG PO TABS
500.0000 mg | ORAL_TABLET | Freq: Once | ORAL | Status: AC
  Administered 2019-04-14: 500 mg via ORAL
  Filled 2019-04-14: qty 1

## 2019-04-14 MED ORDER — SODIUM CHLORIDE 0.9 % IV BOLUS
1000.0000 mL | Freq: Once | INTRAVENOUS | Status: AC
  Administered 2019-04-14: 17:00:00 1000 mL via INTRAVENOUS

## 2019-04-14 MED ORDER — IOHEXOL 300 MG/ML  SOLN
100.0000 mL | Freq: Once | INTRAMUSCULAR | Status: AC | PRN
  Administered 2019-04-14: 100 mL via INTRAVENOUS

## 2019-04-14 MED ORDER — METRONIDAZOLE 500 MG PO TABS: 500.0000 mg | ORAL_TABLET | Freq: Two times a day (BID) | ORAL | 0 refills | Status: DC

## 2019-04-14 MED ORDER — ONDANSETRON HCL 4 MG/2ML IJ SOLN
4.0000 mg | Freq: Once | INTRAMUSCULAR | Status: AC
  Administered 2019-04-14: 4 mg via INTRAVENOUS
  Filled 2019-04-14: qty 2

## 2019-04-14 NOTE — Discharge Instructions (Addendum)
If you test positive for gonorrhea you may need additional antibiotics as Levaquin and Flagyl do not have great gonorrhea coverage.    You may have diarrhea from the antibiotics.  It is very important that you continue to take the antibiotics even if you get diarrhea unless a medical professional tells you that you may stop taking them.  If you stop too early the bacteria you are being treated for will become stronger and you may need different, more powerful antibiotics that have more side effects and worsening diarrhea.  Please stay well hydrated and consider probiotics as they may decrease the severity of your diarrhea.  Please be aware that if you take any hormonal contraception (birth control pills, nexplanon, the ring, etc) that your birth control will not work while you are taking antibiotics and you need to use back up protection as directed on the birth control medication information insert.   Please take Tylenol (acetaminophen) to relieve your pain.  You may take tylenol, up to 1,000 mg (two extra strength pills).  Do not take more than 3,000 mg tylenol in a 24 hour period.  Please check all medication labels as many medications such as pain and cold medications may contain tylenol. Please do not drink alcohol while taking this medication.

## 2019-04-14 NOTE — ED Triage Notes (Signed)
C/o R sided abd pain x 2 weeks with diarrhea and blood in stool.  Seen by PCP this week and received GI referral.

## 2019-04-14 NOTE — ED Provider Notes (Signed)
[facility] EMERGENCY DEPARTMENT Provider Note   CSN: [id number] Arrival date & time: 04/14/19  1059     History Chief Complaint  Patient presents with  . Abdominal Pain    Summer Pruitt is a 24 y.o. female with past medical history of endometriosis, who presents today for evaluation of lower abdominal pain.  She reports that over the past 2 weeks she has been intermittently having diarrhea with blood in her stools which has been evaluated by her PCP and she has been referred to GI.  She states she does not want this specifically evaluated today.  She is here as she had intercourse this morning and had a significant worsening in her pelvic pain to the point that she reports she was crying.  She states that she does not use barrier protection consistently and has previously been diagnosed with chlamydia.  She denies any fevers.  No dysuria, increased frequency or urgency.  HPI     Past Medical History:  Diagnosis Date  . Breast discharge    bilateral, spontaneous/with compression, clear-milky   . Complication of anesthesia   . Depression   . Endometriosis   . Pelvic pain   . PONV (postoperative nausea and vomiting)   . PTSD (post-traumatic stress disorder)    brother committed suicide  . Wears glasses     Patient Active Problem List   Diagnosis Date Noted  . Pneumomediastinum (HCC) 01/04/2018  . GAD (generalized anxiety disorder) 01/13/2017  . 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 Depression 08/29/2015    Past Surgical History:  Procedure Laterality Date  . LAPAROSCOPY N/A 07/04/2016   Procedure: LAPAROSCOPY DIAGNOSTIC possible fulgeration of endometriosis;  Surgeon: Zelphia Cairo, MD;  Location: [facility];  Service: Gynecology;  Laterality: N/A;  . TONSILLECTOMY    . TYMPANOSTOMY TUBE PLACEMENT Bilateral child     OB History   No obstetric history on file.     Family History  Problem Relation Age of Onset  .  Suicidality Brother     Social History   Tobacco Use  . Smoking status: Never Smoker  . Smokeless tobacco: Never Used  Substance Use Topics  . Alcohol use: No  . Drug use: No    Home Medications Prior to Admission medications   Medication Sig Start Date End Date Taking? Authorizing Provider  acetaminophen (TYLENOL) 325 MG tablet Take 650 mg by mouth every 6 (six) hours as needed for moderate pain.   Yes [provider]  albuterol (PROAIR HFA) 108 (90 Base) MCG/ACT inhaler Inhale 1 puff into the lungs every 6 (six) hours as needed for shortness of breath. 04/25/18  Yes [provider]  ALPRAZolam Prudy Feeler) 0.5 MG tablet Take 0.5 mg by mouth 2 (two) times daily as needed for anxiety. 01/13/17  Yes [provider]  amphetamine-dextroamphetamine (ADDERALL) 10 MG tablet Take 10 mg by mouth 2 (two) times daily. 02/17/18  Yes [provider]  EPINEPHrine 0.3 mg/0.3 mL IJ SOAJ injection Inject 0.3 mg into the muscle as needed for anaphylaxis.    Yes [provider]  ibuprofen (ADVIL) 200 MG tablet Take 600 mg by mouth every 6 (six) hours as needed for moderate pain.   Yes [provider]  medroxyPROGESTERone (DEPO-PROVERA) 150 MG/ML injection Inject 150 mg into the muscle every 3 (three) months. 07/19/16  Yes [provider]  sertraline (ZOLOFT) 100 MG tablet Take 150 mg by mouth at bedtime as needed (depression).  12/18/17  Yes [provider]  bisacodyl (DULCOLAX) 5 MG EC tablet Take 1 tablet (5 mg total) by mouth daily as needed for moderate constipation. Patient not taking: Reported on 04/14/2019 01/05/18   Joseph Art, DO  levofloxacin (LEVAQUIN) 500 MG tablet Take 1 tablet (500 mg total) by mouth daily. 04/14/19   Cristina Gong, PA-C  metroNIDAZOLE (FLAGYL) 500 MG tablet Take 1 tablet (500 mg total) by mouth 2 (two) times daily. 04/14/19   Cristina Gong, PA-C  oxyCODONE-acetaminophen (PERCOCET/ROXICET) 5-325 MG  tablet Take 1 tablet by mouth every 4 (four) hours as needed for moderate pain or severe pain. Patient not taking: Reported on 04/14/2019 01/05/18   Joseph Art, DO  polyethylene glycol (MIRALAX / GLYCOLAX) packet Take 17 g by mouth daily as needed for moderate constipation. Patient not taking: Reported on 04/14/2019 01/05/18   Joseph Art, DO    Allergies    Amoxicillin, Doxycycline, Zithromax [azithromycin], and Cefdinir  Review of Systems   Review of Systems  Constitutional: Negative for chills and fever.  HENT: Negative for congestion.   Eyes: Negative for visual disturbance.  Respiratory: Negative for chest tightness and shortness of breath.   Cardiovascular: Negative for chest pain.  Gastrointestinal: Positive for abdominal distention, abdominal pain, blood in stool, diarrhea and nausea. Negative for vomiting.  Genitourinary: Positive for dyspareunia and pelvic pain. Negative for dysuria, flank pain, frequency, hematuria, urgency, vaginal bleeding, vaginal discharge and vaginal pain.  Musculoskeletal: Negative for back pain and neck pain.  All other systems reviewed and are negative.   Physical Exam Updated Vital Signs BP 115/78   Pulse 94   Temp 98.4 F (36.9 C) (Oral)   Resp 16   SpO2 (!) 100%  Note: spo2 of 83 percent is incorrectly reported.    Physical Exam Vitals and nursing note reviewed. Exam conducted with a chaperone present (M. Coffey RN).  Constitutional:      General: She is not in acute distress.    Appearance: She is well-developed. She is not diaphoretic.  HENT:     Head: Normocephalic and atraumatic.  Eyes:     General: No scleral icterus.       Right eye: No discharge.        Left eye: No discharge.     Conjunctiva/sclera: Conjunctivae normal.  Cardiovascular:     Rate and Rhythm: Normal rate and regular rhythm.     Heart sounds: Normal heart sounds.  Pulmonary:     Effort: Pulmonary effort is normal. No respiratory distress.     Breath  sounds: No stridor.  Abdominal:     General: Abdomen is flat. Bowel sounds are normal. There is no distension.     Palpations: Abdomen is soft.     Tenderness: There is abdominal tenderness in the right lower quadrant. There is guarding. Positive signs include McBurney's sign, psoas sign and obturator sign.     Hernia: No hernia is present.  Genitourinary:    Vagina: Vaginal discharge (Scant white thin discharge) present. No bleeding.     Cervix: Cervical motion tenderness present.     Uterus: Normal.      Adnexa:        Right: Tenderness present. No mass or fullness.         Left: No mass, tenderness or fullness.    Musculoskeletal:        General: No deformity.     Cervical back: Normal range of motion.  Skin:  General: Skin is warm and dry.  Neurological:     General: No focal deficit present.     Mental Status: She is alert.     Motor: No abnormal muscle tone.  Psychiatric:        Mood and Affect: Mood normal.        Behavior: Behavior normal.     ED Results / Procedures / Treatments   Labs (all labs ordered are listed, but only abnormal results are displayed) Labs Reviewed  WET PREP, GENITAL - Abnormal; Notable for the following components:      Result Value   Yeast Wet Prep HPF POC   (*)    Value: Specimen diluted due to transport tube containing more than 1 ml of saline, interpret results with caution.   WBC, Wet Prep HPF POC FEW (*)    All other components within normal limits  COMPREHENSIVE METABOLIC PANEL - Abnormal; Notable for the following components:   Creatinine, Ser 1.01 (*)    Total Bilirubin 1.3 (*)    All other components within normal limits  URINALYSIS, ROUTINE W REFLEX MICROSCOPIC - Abnormal; Notable for the following components:   APPearance HAZY (*)    Ketones, ur 20 (*)    All other components within normal limits  LIPASE, BLOOD  CBC  I-STAT BETA HCG BLOOD, ED ([facility], [facility], [facility] ONLY)  GC/CHLAMYDIA PROBE AMP ([place]) NOT AT [facility]     EKG None  Radiology CT Abdomen Pelvis W Contrast  Result Date: 04/14/2019 CLINICAL DATA:  Right lower quadrant abdominal pain. Diarrhea and blood in stool. EXAM: CT ABDOMEN AND PELVIS WITH CONTRAST TECHNIQUE: Multidetector CT imaging of the abdomen and pelvis was performed using the standard protocol following bolus administration of intravenous contrast. CONTRAST:  OMNIPAQUE IOHEXOL 300 MG/ML  SOLN COMPARISON:  Aug 12, 2010. FINDINGS: Lower chest: The lung bases are clear. The heart size is normal. Hepatobiliary: The liver is normal. Normal gallbladder.There is no biliary ductal dilation. Pancreas: Normal contours without ductal dilatation. No peripancreatic fluid collection. Spleen: No splenic laceration or hematoma. Adrenals/Urinary Tract: --Adrenal glands: No adrenal hemorrhage. --Right kidney/ureter: No hydronephrosis or perinephric hematoma. --Left kidney/ureter: No hydronephrosis or perinephric hematoma. --Urinary bladder: Unremarkable. Stomach/Bowel: --Stomach/Duodenum: No hiatal hernia or other gastric abnormality. Normal duodenal course and caliber. --Small bowel: No dilatation or inflammation. --Colon: No focal abnormality. --Appendix: Normal. Vascular/Lymphatic: Normal course and caliber of the major abdominal vessels. --No retroperitoneal lymphadenopathy. --No mesenteric lymphadenopathy. --No pelvic or inguinal lymphadenopathy. Reproductive: Unremarkable Other: No ascites or free air. The abdominal wall is normal. Musculoskeletal. No acute displaced fractures. IMPRESSION: No acute abdominopelvic abnormality. Electronically Signed   By: Katherine Mantle M.D.   On: 04/14/2019 18:12    Procedures Procedures (including critical care time)  Medications Ordered in ED Medications  sodium chloride flush (NS) 0.9 % injection 3 mL (3 mLs Intravenous Not Given 04/14/19 1720)  morphine 4 MG/ML injection 4 mg (4 mg Intravenous Given 04/14/19 1723)  sodium chloride 0.9 % bolus 1,000 mL (0 mLs  Intravenous Stopped 04/14/19 2054)  ondansetron (ZOFRAN) injection 4 mg (4 mg Intravenous Given 04/14/19 1723)  iohexol (OMNIPAQUE) 300 MG/ML solution 100 mL (100 mLs Intravenous Contrast Given 04/14/19 1753)  levofloxacin (LEVAQUIN) tablet 500 mg (500 mg Oral Given 04/14/19 2042)  metroNIDAZOLE (FLAGYL) tablet 500 mg (500 mg Oral Given 04/14/19 2041)    ED Course  I have reviewed the triage vital signs and the nursing notes.  Pertinent labs & imaging results that were  available during my care of the patient were reviewed by me and considered in my medical decision making (see chart for details).  Clinical Course as of Apr 13 2204  Nancy Fetter Apr 14, 2019  1913 Spoke with Corene Cornea with pharmacy on appropriate treatment for possible PID given patient anaphylaxis to amoxicillin, doxycycline, and azithromycin.  Best option for chlamydia coverage would be a fluoroquinolone and Flagyl such as Levaquin.  If she test positive for gonorrhea we will have to consider additional antibiotics.   [EH]    Clinical Course User Index [EH] Ollen Gross   MDM Rules/Calculators/A&P                      Lacinda Axon Salome is a 24 year old woman who presents today for evaluation of right sided abdominal pain.  She has venously been seen by her PCP with reactive referral to GI due to part of blood in stools.  She has not wishing for this to be evaluated today, rather she wishes only for her dyspareunia to be evaluated.  CBC was obtained without evidence of leukocytosis, anemia or other abnormalities.  CMP without significant derangements.  UA without evidence of infection.  Pregnancy test is negative.  Pelvic exam shows cervical motion tenderness with worse tenderness in the right adnexa.  Wet prep with few white blood cells.  GC testing is sent.  Given the significant adnexal tenderness, and patient still having her appendix, CT abdomen pelvis was obtained without visualized abnormalities.  Suspect, given normal-appearing  appendix, the patient may have PID.  Patient had multiple significant allergies to antibiotics including amoxicillin, doxycycline and azithromycin.  I spoke with pharmacy who recommended treating her with Flagyl and Levaquin.  She and I discussed risks of Levaquin.  EKG was obtained without evidence of prolonged QT.  She is aware that if she does test positive for gonorrhea she may need addition of extra antibiotics based on local resistance patterns.    Recommended close outpatient follow-up with her OB/GYN in the next week. She does not meet sepsis criteria, she is afebrile, not tachycardic or tachypneic without leukocytosis and is overall well-appearing.  Return precautions were discussed with patient who states their understanding.  At the time of discharge patient denied any unaddressed complaints or concerns.  Patient is agreeable for discharge home.  Note: Portions of this report may have been transcribed using voice recognition software. Every effort was made to ensure accuracy; however, inadvertent computerized transcription errors may be present   Final Clinical Impression(s) / ED Diagnoses Final diagnoses:  PID (acute pelvic inflammatory disease)    Rx / DC Orders ED Discharge Orders         Ordered    levofloxacin (LEVAQUIN) 500 MG tablet  Daily     04/14/19 1959    metroNIDAZOLE (FLAGYL) 500 MG tablet  2 times daily     04/14/19 1959           Ollen Gross 04/14/19 2213    Fredia Sorrow, MD 04/21/19 1004

## 2019-04-15 LAB — GC/CHLAMYDIA PROBE AMP (~~LOC~~) NOT AT ARMC
Chlamydia: NEGATIVE
Neisseria Gonorrhea: NEGATIVE

## 2019-10-14 ENCOUNTER — Emergency Department (HOSPITAL_BASED_OUTPATIENT_CLINIC_OR_DEPARTMENT_OTHER): Payer: BC Managed Care – PPO

## 2019-10-14 ENCOUNTER — Encounter (HOSPITAL_BASED_OUTPATIENT_CLINIC_OR_DEPARTMENT_OTHER): Payer: Self-pay

## 2019-10-14 ENCOUNTER — Other Ambulatory Visit: Payer: Self-pay

## 2019-10-14 ENCOUNTER — Emergency Department (HOSPITAL_BASED_OUTPATIENT_CLINIC_OR_DEPARTMENT_OTHER)
Admission: EM | Admit: 2019-10-14 | Discharge: 2019-10-14 | Disposition: A | Discharge status: Home / Self Care | Payer: BC Managed Care – PPO | Attending: Emergency Medicine | Admitting: Emergency Medicine

## 2019-10-14 DIAGNOSIS — N939 Abnormal uterine and vaginal bleeding, unspecified: Secondary | ICD-10-CM | POA: Diagnosis not present

## 2019-10-14 DIAGNOSIS — M545 Low back pain: Secondary | ICD-10-CM | POA: Diagnosis not present

## 2019-10-14 DIAGNOSIS — R1030 Lower abdominal pain, unspecified: Secondary | ICD-10-CM | POA: Diagnosis present

## 2019-10-14 DIAGNOSIS — R112 Nausea with vomiting, unspecified: Secondary | ICD-10-CM | POA: Insufficient documentation

## 2019-10-14 HISTORY — DX: Unspecified ovarian cyst, unspecified side: N83.209

## 2019-10-14 LAB — URINALYSIS, ROUTINE W REFLEX MICROSCOPIC
Bilirubin Urine: NEGATIVE
Glucose, UA: NEGATIVE mg/dL
Ketones, ur: NEGATIVE mg/dL
Nitrite: NEGATIVE
Protein, ur: NEGATIVE mg/dL
Specific Gravity, Urine: 1.025 (ref 1.005–1.030)
pH: 7.5 (ref 5.0–8.0)

## 2019-10-14 LAB — URINALYSIS, MICROSCOPIC (REFLEX)

## 2019-10-14 LAB — WET PREP, GENITAL
Clue Cells Wet Prep HPF POC: NONE SEEN
Sperm: NONE SEEN
Trich, Wet Prep: NONE SEEN
Yeast Wet Prep HPF POC: NONE SEEN

## 2019-10-14 LAB — PREGNANCY, URINE: Preg Test, Ur: NEGATIVE

## 2019-10-14 MED ORDER — KETOROLAC TROMETHAMINE 60 MG/2ML IM SOLN
60.0000 mg | Freq: Once | INTRAMUSCULAR | Status: AC
  Administered 2019-10-14: 60 mg via INTRAMUSCULAR
  Filled 2019-10-14: qty 2

## 2019-10-14 MED ORDER — ONDANSETRON 4 MG PO TBDP: 4.0000 mg | ORAL_TABLET | Freq: Three times a day (TID) | ORAL | 0 refills | Status: DC | PRN

## 2019-10-14 MED ORDER — OXYCODONE-ACETAMINOPHEN 5-325 MG PO TABS
1.0000 | ORAL_TABLET | Freq: Once | ORAL | Status: AC
  Administered 2019-10-14: 1 via ORAL
  Filled 2019-10-14: qty 1

## 2019-10-14 MED ORDER — ONDANSETRON 4 MG PO TBDP
4.0000 mg | ORAL_TABLET | Freq: Once | ORAL | Status: AC
  Administered 2019-10-14: 4 mg via ORAL
  Filled 2019-10-14: qty 1

## 2019-10-14 MED ORDER — KETOROLAC TROMETHAMINE 10 MG PO TABS: 10.0000 mg | ORAL_TABLET | Freq: Four times a day (QID) | ORAL | 0 refills | Status: DC | PRN

## 2019-10-14 MED FILL — ONDANSETRON ODT 4MG TBDP: 4 | 6 days supply | Qty: 20 | Fill #0

## 2019-10-14 MED FILL — KETOROLAC 10 MG TABLET: 10 | 5 days supply | Qty: 20 | Fill #0

## 2019-10-14 NOTE — ED Notes (Signed)
Patient refused pelvic exam requesting to have an ultrasound instead.

## 2019-10-14 NOTE — ED Triage Notes (Signed)
Pt c/o vaginal bleeding, abd pain, n/v-sx started yesterday am-NAd-steady gait

## 2019-10-14 NOTE — Discharge Instructions (Signed)
IMPRESSION:  1.  Normal ovaries without evidence of torsion.     2. Normal size uterus. Tiny amount of nonspecific fluid within the  endometrial canal over the lower uterine segment and cervix.   You were seen in the ED for pelvic pain and vaginal bleeding  On exam you had some blood coming out of her cervical os and some tenderness with palpation on the left ovary area  Ultrasound was performed and it did not show ovarian torsion, ectopic pregnancy, or any other specific findings to explain your symptoms.  Ultrasound did show very small amount of blood in the uterus, it is unclear what the significance of this is.  We discussed possibilities of ovarian cyst that may have ruptured.  I have given you prescription for ketorolac which can help with your ongoing pain.  I have given you prescription for ondansetron that can be used for nausea as needed.  I suspect some of your symptoms are from endometriosis.  Please follow-up with your gynecologist in the next week for further discussion of your breakthrough pain and symptoms

## 2019-10-14 NOTE — ED Provider Notes (Signed)
[facility] EMERGENCY DEPARTMENT Provider Note   CSN: [id number] Arrival date & time: 10/14/19  1129     History Chief Complaint  Patient presents with  . Vaginal Bleeding    Summer Pruitt is a 24 y.o. female with past medical history of endometriosis status post surgery to remove lesions, chronic pelvic pain on ibuprofen, ovarian cysts presents to the ED for evaluation of lower abdominal pain associated with nausea, vomiting, vaginal bleeding. Pelvic pain began suddenly while having vaginal intercourse Sunday morning around 3 AM. Initially it was mild, intermittent but has gradually worsened now a 9/10. It is localized in the left lower pelvis and abdomen with some radiation into the left low back. It hurts to move, breathe. She felt like she couldn't get out of bed today. Took ibuprofen without relief. Has had associated nausea and vomiting since Sunday morning. Has vomited at least 15 times since and currently nauseous. Began having vaginal bleeding yesterday. Has been wearing a pad and a tampon, states usually the blood is mild but occasionally has filled up a tampon and a pad. No clots or tissue. Has changed two pads today moderately saturated with blood. She is very nervous because she says her sister and her mother have both had ectopic pregnancies. Her sister told her her pregnancy test was negative when she had an ectopic pregnancy and this required surgery. She is sexually active with 1 female partner without condom use, monogamous relationship. She is not concerned about an STD. Had a pelvic exam and Pap smear and STD screening last month and everything was normal. She uses birth control patches and compliant. Last day of her LMP was June 19. No history of irregular bleeding between her periods usually.  No fever. No dysuria. History of kidney stones but this feels different.  No recent abnormal vaginal discharge.   HPI     Past Medical History:  Diagnosis Date  . Breast  discharge    bilateral, spontaneous/with compression, clear-milky   . Complication of anesthesia   . Depression   . Endometriosis   . Ovarian cyst   . Pelvic pain   . PONV (postoperative nausea and vomiting)   . PTSD (post-traumatic stress disorder)    brother committed suicide  . Wears glasses     Patient Active Problem List   Diagnosis Date Noted  . Pneumomediastinum (HCC) 01/04/2018  . GAD (generalized anxiety disorder) 01/13/2017  . 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 Depression 08/29/2015    Past Surgical History:  Procedure Laterality Date  . LAPAROSCOPY N/A 07/04/2016   Procedure: LAPAROSCOPY DIAGNOSTIC possible fulgeration of endometriosis;  Surgeon: Zelphia Cairo, MD;  Location: [facility];  Service: Gynecology;  Laterality: N/A;  . TONSILLECTOMY    . TYMPANOSTOMY TUBE PLACEMENT Bilateral child     OB History   No obstetric history on file.     Family History  Problem Relation Age of Onset  . Suicidality Brother     Social History   Tobacco Use  . Smoking status: Never Smoker  . Smokeless tobacco: Never Used  Vaping Use  . Vaping Use: Some days  Substance Use Topics  . Alcohol use: Yes    Comment: occ  . Drug use: Yes    Types: Marijuana    Comment: occ    Home Medications Prior to Admission medications   Medication Sig Start Date End Date Taking? Authorizing Provider  ibuprofen (ADVIL) 200 MG tablet Take 600 mg by mouth  every 6 (six) hours as needed for moderate pain.   Yes [provider]  norelgestromin-ethinyl estradiol (ZAFEMY) 150-35 MCG/24HR transdermal patch Place 1 patch onto the skin once a week.   Yes [provider]  acetaminophen (TYLENOL) 325 MG tablet Take 650 mg by mouth every 6 (six) hours as needed for moderate pain.    [provider]  albuterol (PROAIR HFA) 108 (90 Base) MCG/ACT inhaler Inhale 1 puff into the lungs every 6 (six) hours as needed for shortness of breath.  04/25/18   [provider]  ALPRAZolam Prudy Feeler) 0.5 MG tablet Take 0.5 mg by mouth 2 (two) times daily as needed for anxiety. 01/13/17   [provider]  amphetamine-dextroamphetamine (ADDERALL) 10 MG tablet Take 10 mg by mouth 2 (two) times daily. 02/17/18   [provider]  EPINEPHrine 0.3 mg/0.3 mL IJ SOAJ injection Inject 0.3 mg into the muscle as needed for anaphylaxis.     [provider]  ketorolac (TORADOL) 10 MG tablet Take 1 tablet (10 mg total) by mouth every 6 (six) hours as needed. 10/14/19   Liberty Handy, PA-C  ondansetron (ZOFRAN ODT) 4 MG disintegrating tablet Take 1 tablet (4 mg total) by mouth every 8 (eight) hours as needed for nausea or vomiting. 10/14/19   Liberty Handy, PA-C    Allergies    Amoxicillin, Doxycycline, Zithromax [azithromycin], and Cefdinir  Review of Systems   Review of Systems  Gastrointestinal: Positive for abdominal pain, nausea and vomiting.  Genitourinary: Positive for menstrual problem and vaginal bleeding.  Musculoskeletal: Positive for back pain.  All other systems reviewed and are negative.   Physical Exam Updated Vital Signs BP 106/69 (BP Location: Right Arm)   Pulse (!) 57   Temp 98.9 F (37.2 C) (Oral)   Resp 19   Ht 5\' 5"  (1.651 m)   Wt 73.8 kg   SpO2 100%   BMI 27.09 kg/m   Physical Exam Vitals and nursing note reviewed. Exam conducted with a chaperone present.  Constitutional:      Appearance: She is well-developed.     Comments: Teary eyed. Non toxic appearing.   HENT:     Head: Normocephalic and atraumatic.     Nose: Nose normal.  Eyes:     Conjunctiva/sclera: Conjunctivae normal.  Cardiovascular:     Rate and Rhythm: Normal rate and regular rhythm.  Pulmonary:     Effort: Pulmonary effort is normal.     Breath sounds: Normal breath sounds.  Abdominal:     General: Bowel sounds are normal.     Palpations: Abdomen is soft.     Tenderness: There is abdominal tenderness.      Comments: Very low left sided pelvic and abdominal tenderness. TTP left low lateral mid abdominal tenderness. Left CVAT.   Genitourinary:    Cervix: Cervical bleeding present.     Adnexa:        Left: Tenderness present.      Comments:  Exam performed with RN at bedside for assistance. External genitalia without lesions.  No groin lymphadenopathy.  Vaginal mucosa and cervix pink without lesions.  Moderate dark blood in vaginal vault and through cervical os.  Mild pain reported with CM movement, no chandelier sign.  Non tender right adnexa.  Moderate pain with LEFT adnexal movement. Perianal skin normal without lesions. Musculoskeletal:        General: Normal range of motion.     Cervical back: Normal range of motion.  Skin:  General: Skin is warm and dry.     Capillary Refill: Capillary refill takes less than 2 seconds.  Neurological:     Mental Status: She is alert.  Psychiatric:        Behavior: Behavior normal.     ED Results / Procedures / Treatments   Labs (all labs ordered are listed, but only abnormal results are displayed) Labs Reviewed  WET PREP, GENITAL - Abnormal; Notable for the following components:      Result Value   WBC, Wet Prep HPF POC MODERATE (*)    All other components within normal limits  URINALYSIS, ROUTINE W REFLEX MICROSCOPIC - Abnormal; Notable for the following components:   APPearance HAZY (*)    Hgb urine dipstick LARGE (*)    Leukocytes,Ua TRACE (*)    All other components within normal limits  URINALYSIS, MICROSCOPIC (REFLEX) - Abnormal; Notable for the following components:   Bacteria, UA MANY (*)    All other components within normal limits  PREGNANCY, URINE  GC/CHLAMYDIA PROBE AMP ([place]) NOT [facility]    EKG None  Radiology US Art/Ven Flow Abd Pelv Doppler  Result Date: 10/14/2019 CLINICAL DATA:  Left lower quadrant pain.  History of endometriosis. EXAM: TRANSABDOMINAL AND TRANSVAGINAL ULTRASOUND OF PELVIS DOPPLER ULTRASOUND OF  OVARIES TECHNIQUE: Both transabdominal and transvaginal ultrasound examinations of the pelvis were performed. Transabdominal technique was performed for global imaging of the pelvis including uterus, ovaries, adnexal regions, and pelvic cul-de-sac. It was necessary to proceed with endovaginal exam following the transabdominal exam to visualize the endometrium and ovaries. Color and duplex Doppler ultrasound was utilized to evaluate blood flow to the ovaries. COMPARISON:  None. FINDINGS: Uterus Measurements: 8.1 x 4.1 x 2.8 cm = volume: 48 mL. No fibroids or other mass visualized. Endometrium Thickness: <10 mm. No focal abnormality visualized. Small amount of fluid present over the endometrial canal of the lower uterine segment and cervix. Right ovary Measurements: 3.0 x 2.4 x 1.8 cm = volume: 6.7 mL. Normal appearance/no adnexal mass. Left ovary Measurements: 2.9 x 2.2 x 2.3 cm = volume: 7.6 mL. Normal appearance/no adnexal mass. Pulsed Doppler evaluation of both ovaries demonstrates normal low-resistance arterial and venous waveforms. Other findings No abnormal free fluid. IMPRESSION: 1.  Normal ovaries without evidence of torsion. 2. Normal size uterus. Tiny amount of nonspecific fluid within the endometrial canal over the lower uterine segment and cervix. Electronically Signed   By: Elberta Fortisaniel  Boyle M.D.   On: 10/14/2019 15:38    Procedures Procedures (including critical care time)  Medications Ordered in ED Medications  ketorolac (TORADOL) injection 60 mg (60 mg Intramuscular Given 10/14/19 1337)  oxyCODONE-acetaminophen (PERCOCET/ROXICET) 5-325 MG per tablet 1 tablet (1 tablet Oral Given 10/14/19 1337)  ondansetron (ZOFRAN-ODT) disintegrating tablet 4 mg (4 mg Oral Given 10/14/19 1337)    ED Course  I have reviewed the triage vital signs and the nursing notes.  Pertinent labs & imaging results that were available during my care of the patient were reviewed by me and considered in my medical decision  making (see chart for details).  Clinical Course as of Oct 14 1715  Mon Oct 14, 2019  1322 Leukocytes,Ua(!): TRACE [CG]  1322 Bacteria, UA(!): MANY [CG]  1322 Mucus: PRESENT [CG]  1322 Preg Test, Ur: NEGATIVE [CG]  1605 IMPRESSION: 1. Normal ovaries without evidence of torsion.  2. Normal size uterus. Tiny amount of nonspecific fluid within the endometrial canal over the lower uterine segment and cervix.  US Art/Ven  Flow Abd Pelv Doppler [CG]    Clinical Course User Index [CG] Liberty Handy, PA-C   MDM Rules/Calculators/A&P                          This patient is a 24 year old female with history of endometriosis, chronic pelvic pain presents for acute left pelvic pain during vaginal intercourse followed with irregular vaginal bleeding.  I have reviewed patient's available medical record, seen in January for possible PID.  Follows up with OB/GYN but unable to see their notes.  She tells me she has had painful periods for a long time and had surgery to remove lesions for endometriosis in 2018 but the pain has returned and acutely worsening the last few weeks.  Per patient report OB/GYN is suggesting a second surgery.  Differential diagnosis includes ectopic pregnancy, ruptured ovarian cyst, less likely PID.  She has history of kidney stones but presentation is not consistent with this.  She has no diarrhea, constipation to the point to a lower GI source.  Torsion also on differential.  I have ordered a urinalysis, pelvic swabs.  Pelvic exam performed shows moderate amount of blood in the vaginal vault and cervical os and left side adnexal tenderness.  Very mild pain with CMT.  No signs of cervix hematoma or any other external bleeding source.  Transvaginal ultrasound personally reviewed and interpreted, as above.  Normal flow.  No torsion.  Tiny amount of nonspecific fluid in the endometrial canal and lower uterus.  Have discussed results with patient who is frustrated due to  ongoing pelvic pain and recurrence of symptoms.  Will discharge with a trial of Toradol since she states ibuprofen and naproxen no longer helping.  Suspect symptoms are likely from endometriosis.  Overall low risk for STDs and she is not concerned for this, doubt PID in the setting.  Recommended follow-up with OB/GYN for further discussion of her ongoing symptoms.  Return precautions discussed.  She is comfortable with this plan. Final Clinical Impression(s) / ED Diagnoses Final diagnoses:  Vaginal bleeding    Rx / DC Orders ED Discharge Orders         Ordered    ketorolac (TORADOL) 10 MG tablet  Every 6 hours PRN     Discontinue  Reprint     10/14/19 1604    ondansetron (ZOFRAN ODT) 4 MG disintegrating tablet  Every 8 hours PRN     Discontinue  Reprint     10/14/19 1607           Liberty Handy, PA-C 10/14/19 1718    Benjiman Core, MD 10/15/19 [phone]

## 2019-10-14 NOTE — ED Notes (Signed)
PT refused pelvic, See previous note

## 2019-10-15 LAB — GC/CHLAMYDIA PROBE AMP (~~LOC~~) NOT AT ARMC
Chlamydia: NEGATIVE
Comment: NEGATIVE
Comment: NORMAL
Neisseria Gonorrhea: NEGATIVE

## 2020-12-14 ENCOUNTER — Emergency Department (HOSPITAL_BASED_OUTPATIENT_CLINIC_OR_DEPARTMENT_OTHER): Payer: BC Managed Care – PPO

## 2020-12-14 ENCOUNTER — Emergency Department (HOSPITAL_BASED_OUTPATIENT_CLINIC_OR_DEPARTMENT_OTHER)
Admission: EM | Admit: 2020-12-14 | Discharge: 2020-12-14 | Disposition: A | Discharge status: Home / Self Care | Payer: BC Managed Care – PPO | Attending: Emergency Medicine | Admitting: Emergency Medicine

## 2020-12-14 ENCOUNTER — Encounter (HOSPITAL_BASED_OUTPATIENT_CLINIC_OR_DEPARTMENT_OTHER): Payer: Self-pay | Admitting: Emergency Medicine

## 2020-12-14 DIAGNOSIS — R1032 Left lower quadrant pain: Secondary | ICD-10-CM | POA: Diagnosis present

## 2020-12-14 DIAGNOSIS — N83201 Unspecified ovarian cyst, right side: Secondary | ICD-10-CM | POA: Diagnosis not present

## 2020-12-14 DIAGNOSIS — R102 Pelvic and perineal pain: Secondary | ICD-10-CM

## 2020-12-14 LAB — URINALYSIS, ROUTINE W REFLEX MICROSCOPIC
Bilirubin Urine: NEGATIVE
Glucose, UA: NEGATIVE mg/dL
Hgb urine dipstick: NEGATIVE
Ketones, ur: NEGATIVE mg/dL
Leukocytes,Ua: NEGATIVE
Nitrite: NEGATIVE
Protein, ur: NEGATIVE mg/dL
Specific Gravity, Urine: 1.015 (ref 1.005–1.030)
pH: 6.5 (ref 5.0–8.0)

## 2020-12-14 LAB — COMPREHENSIVE METABOLIC PANEL
ALT: 7 U/L (ref 0–44)
AST: 11 U/L — ABNORMAL LOW (ref 15–41)
Albumin: 4.4 g/dL (ref 3.5–5.0)
Alkaline Phosphatase: 59 U/L (ref 38–126)
Anion gap: 7 (ref 5–15)
BUN: 9 mg/dL (ref 6–20)
CO2: 27 mmol/L (ref 22–32)
Calcium: 9.3 mg/dL (ref 8.9–10.3)
Chloride: 104 mmol/L (ref 98–111)
Creatinine, Ser: 0.8 mg/dL (ref 0.44–1.00)
GFR, Estimated: >60 mL/min (ref >60)
Glucose, Bld: 97 mg/dL (ref 70–99)
Potassium: 4.1 mmol/L (ref 3.5–5.1)
Sodium: 138 mmol/L (ref 135–145)
Total Bilirubin: 0.4 mg/dL (ref 0.3–1.2)
Total Protein: 6.6 g/dL (ref 6.5–8.1)

## 2020-12-14 LAB — CBC
HCT: 36.7 % (ref 36.0–46.0)
Hemoglobin: 12.8 g/dL (ref 12.0–15.0)
MCH: 31.4 pg (ref 26.0–34.0)
MCHC: 34.9 g/dL (ref 30.0–36.0)
MCV: 90 fL (ref 80.0–100.0)
Platelets: 242 10*3/uL (ref 150–400)
RBC: 4.08 MIL/uL (ref 3.87–5.11)
RDW: 12 % (ref 11.5–15.5)
WBC: 7.7 10*3/uL (ref 4.0–10.5)
nRBC: 0 % (ref 0.0–0.2)

## 2020-12-14 LAB — PREGNANCY, URINE: Preg Test, Ur: NEGATIVE

## 2020-12-14 LAB — WET PREP, GENITAL
Clue Cells Wet Prep HPF POC: NONE SEEN
Sperm: NONE SEEN
Trich, Wet Prep: NONE SEEN
WBC, Wet Prep HPF POC: NONE SEEN
Yeast Wet Prep HPF POC: NONE SEEN

## 2020-12-14 LAB — LIPASE, BLOOD: Lipase: 52 U/L — ABNORMAL HIGH (ref 11–51)

## 2020-12-14 MED ORDER — HYDROCODONE-ACETAMINOPHEN 5-325 MG PO TABS: 1.0000 | ORAL_TABLET | Freq: Four times a day (QID) | ORAL | 0 refills | Status: DC | PRN

## 2020-12-14 MED ORDER — KETOROLAC TROMETHAMINE 15 MG/ML IJ SOLN
15.0000 mg | Freq: Once | INTRAMUSCULAR | Status: AC
  Administered 2020-12-14: 15 mg via INTRAVENOUS
  Filled 2020-12-14: qty 1

## 2020-12-14 MED ORDER — HYDROCODONE-ACETAMINOPHEN 5-325 MG PO TABS
1.0000 | ORAL_TABLET | Freq: Once | ORAL | Status: AC
  Administered 2020-12-14: 1 via ORAL
  Filled 2020-12-14: qty 1

## 2020-12-14 NOTE — Discharge Instructions (Addendum)
I have prescribed you a strong narcotic called Vicodin. Please only take this as prescribed. This medication also has tylenol in it, so please be sure you are not taking more than 3000 mg of tylenol per day. Do not drive or operate heavy machinery after taking this medication. Do not mix it with alcohol.   Please follow-up with your OB/GYN tomorrow to schedule an appointment for reevaluation.  If you develop new or worsening symptoms please come back to the emergency department.  It was a pleasure to meet you.

## 2020-12-14 NOTE — ED Notes (Signed)
Pt in [place]

## 2020-12-14 NOTE — ED Triage Notes (Addendum)
Pt arrives pov with driver, reports LLQ pain x 3 days, endorses nausea, denies constipation or diarrhea, last BM today. Endorses hx of endometriosis. Endorses Aleve at 10am today with little relief, subj fever of 100.0 yesterday

## 2020-12-14 NOTE — ED Provider Notes (Signed)
[facility]   CSN: [id number] Arrival date & time: 12/14/20  1731     History Chief Complaint  Patient presents with   Abdominal Pain    Summer A Sharol HarnessSimmons is a 25 y.o. female.  HPI  25 year old female with a history of endometriosis status post surgery to remove lesions, chronic pelvic pain, ovarian cyst, who presents to the emergency department due to left lower quadrant pain.  Patient states her last menstrual cycle started on August 26 and ended on August 29.  She states that she will intermittently have extremely painful menstrual cycles and pass large clots and states that this was the first menstrual cycle in "many months" in which this happened.  She then began experiencing left lower quadrant pain 2 days ago.  States her symptoms have been waxing and waning but worsening.  She states her current pain is about 7/10 but at its worst has been 9/10.  Reports associated nausea without vomiting.  She also states [place] all [place].  She states she was feeling hotter than normal and her mother measured her axillary temperature which was 100 F at that time.  She denies vaginal discharge, vaginal bleeding, dysuria.  She states she is sexually active with a female partner and they are not using protection.     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     Family History  Problem Relation Age of Onset   Suicidality Brother     Social History   Tobacco Use   Smoking status: Never   Smokeless tobacco: Never  Vaping Use   Vaping Use: Every day  Substance Use Topics   Alcohol use: Yes    Comment: occ   Drug use: Not Currently    Types: Marijuana    Comment: occ    Home Medications Prior to Admission medications   Medication Sig Start Date End Date Taking? Authorizing Provider  HYDROcodone-acetaminophen (NORCO/VICODIN) 5-325 MG tablet Take 1 tablet by mouth every 6 (six) hours as needed. 12/14/20  Yes Placido SouJoldersma, Vale Mousseau, PA-C  acetaminophen (TYLENOL) 325 MG tablet Take 650 mg by mouth every 6 (six) hours as needed for moderate pain.    [provider]  albuterol (PROAIR HFA) 108 (90 Base) MCG/ACT inhaler Inhale 1 puff into the lungs every 6 (six) hours as needed for shortness of breath. 04/25/18   [provider]  ALPRAZolam Prudy Feeler(XANAX) 0.5 MG tablet Take 0.5 mg by mouth 2 (two) times daily  as needed for anxiety. 01/13/17   [provider]  amphetamine-dextroamphetamine (ADDERALL) 10 MG tablet Take 10 mg by mouth 2 (two) times daily. 02/17/18   [provider]  EPINEPHrine 0.3 mg/0.3 mL IJ SOAJ injection Inject 0.3 mg into the muscle as needed for anaphylaxis.     [provider]  ibuprofen (ADVIL) 200 MG tablet Take 600 mg by mouth every 6 (six) hours as needed for moderate pain.    [provider]  ketorolac (TORADOL) 10 MG tablet Take 1 tablet (10 mg total) by mouth every 6 (six) hours as needed. 10/14/19   Liberty Handy, PA-C   norelgestromin-ethinyl estradiol (ZAFEMY) 150-35 MCG/24HR transdermal patch Place 1 patch onto the skin once a week.    [provider]  ondansetron (ZOFRAN ODT) 4 MG disintegrating tablet Take 1 tablet (4 mg total) by mouth every 8 (eight) hours as needed for nausea or vomiting. 10/14/19   Liberty Handy, PA-C    Allergies    Amoxicillin, Doxycycline, Zithromax [azithromycin], and Cefdinir  Review of Systems   Review of Systems  All other systems reviewed and are negative. Ten systems reviewed and are negative for acute change, except as noted in the HPI.   Physical Exam Updated Vital Signs BP 116/66 (BP Location: Right Arm)   Pulse 77   Temp 97.7 F (36.5 C) (Oral)   Resp 15   Ht 5\' 5"  (1.651 m)   Wt 65.8 kg   LMP 12/04/2020   SpO2 100%   BMI 24.13 kg/m   Physical Exam Vitals and nursing note reviewed.  Constitutional:      General: She is not in acute distress.    Appearance: Normal appearance. She is not ill-appearing, toxic-appearing or diaphoretic.  HENT:     Head: Normocephalic and atraumatic.     Right Ear: External ear normal.     Left Ear: External ear normal.     Nose: Nose normal.     Mouth/Throat:     Mouth: Mucous membranes are moist.     Pharynx: Oropharynx is clear. No oropharyngeal exudate or posterior oropharyngeal erythema.  Eyes:     Extraocular Movements: Extraocular movements intact.  Cardiovascular:     Rate and Rhythm: Normal rate and regular rhythm.     Pulses: Normal pulses.     Heart sounds: Normal heart sounds. No murmur heard.   No friction rub. No gallop.  Pulmonary:     Effort: Pulmonary effort is normal. No respiratory distress.     Breath sounds: Normal breath sounds. No stridor. No wheezing, rhonchi or rales.  Abdominal:     General: Abdomen is flat.     Palpations: Abdomen is soft.     Tenderness: There is abdominal tenderness in the right lower quadrant, suprapubic area and left lower quadrant.     Comments: Abdomen  is flat and soft.  Patient has moderate tenderness diffusely along the lower abdomen.  Genitourinary:    Comments: Female nursing chaperone present.  Normal-appearing vulvar anatomy.  Normal-appearing vaginal mucosa.  Closed cervical os.  Small amount of white discharge noted in the vaginal vault.  Mild bilateral adnexal tenderness noted.  No cervical motion tenderness noted. Musculoskeletal:        General: Normal range of motion.     Cervical back: Normal range of motion and neck supple. No tenderness.  Skin:    General: Skin is warm and dry.  Neurological:     General: No focal deficit present.  Mental Status: She is alert and oriented to person, place, and time.  Psychiatric:        Mood and Affect: Mood normal.        Behavior: Behavior normal.   ED Results / Procedures / Treatments   Labs (all labs ordered are listed, but only abnormal results are displayed) Labs Reviewed  LIPASE, BLOOD - Abnormal; Notable for the following components:      Result Value   Lipase 52 (*)    All other components within normal limits  COMPREHENSIVE METABOLIC PANEL - Abnormal; Notable for the following components:   AST 11 (*)    All other components within normal limits  URINALYSIS, ROUTINE W REFLEX MICROSCOPIC - Abnormal; Notable for the following components:   Color, Urine COLORLESS (*)    All other components within normal limits  WET PREP, GENITAL  CBC  PREGNANCY, URINE  GC/CHLAMYDIA PROBE AMP ([place]) NOT AT [facility]   EKG None  Radiology US PELVIC COMPLETE W TRANSVAGINAL AND TORSION R/O  Result Date: 12/14/2020 CLINICAL DATA:  Initial evaluation for acute left lower quadrant abdominal pain, nausea, fever. EXAM: TRANSABDOMINAL AND TRANSVAGINAL ULTRASOUND OF PELVIS DOPPLER ULTRASOUND OF OVARIES TECHNIQUE: Both transabdominal and transvaginal ultrasound examinations of the pelvis were performed. Transabdominal technique was performed for global imaging of the pelvis including uterus,  ovaries, adnexal regions, and pelvic cul-de-sac. It was necessary to proceed with endovaginal exam following the transabdominal exam to visualize the uterus, endometrium, and ovaries. Color and duplex Doppler ultrasound was utilized to evaluate blood flow to the ovaries. COMPARISON:  Prior ultrasound from 10/14/2019. FINDINGS: Uterus Measurements: 8.5 x 3.4 x 5.0 cm = volume: 75.5 mL. Uterus is anteverted. No discrete fibroid or other mass. Subcentimeter nabothian cyst noted at the cervix. Endometrium Thickness: 12 mm. No focal abnormality visualized. Small volume simple fluid noted within the endocervical canal. Right ovary Measurements: 3.7 x 2.9 x 2.7 cm = volume: 15.1 mL. 2.7 cm degenerating corpus luteal cyst with peripherally increased vascularity seen within the right ovary. No other adnexal mass. Left ovary Measurements: 2.7 x 2.8 x 2.5 cm = volume: 9.7 mL. Left ovary somewhat heterogeneous without definite discrete lesion or adnexal mass. Pulsed Doppler evaluation of both ovaries demonstrates normal low-resistance arterial and venous waveforms. Other findings Trace free fluid seen within the pelvis. IMPRESSION: 1. 2.7 cm degenerating right ovarian corpus luteal cyst with associated trace free fluid within the pelvis. Finding could contribute to underlying pelvic pain. 2. Small volume simple fluid within the endocervical canal, nonspecific. Correlation with menstrual cycle suggested. 3. Otherwise negative pelvic ultrasound. No evidence for torsion or other acute abnormality. Electronically Signed   By: Rise Mu M.D.   On: 12/14/2020 19:49    Procedures Procedures   Medications Ordered in ED Medications  ketorolac (TORADOL) 15 MG/ML injection 15 mg (has no administration in time range)  HYDROcodone-acetaminophen (NORCO/VICODIN) 5-325 MG per tablet 1 tablet (1 tablet Oral Given 12/14/20 1804)   ED Course  I have reviewed the triage vital signs and the nursing notes.  Pertinent labs &  imaging results that were available during my care of the patient were reviewed by me and considered in my medical decision making (see chart for details).    MDM Rules/Calculators/A&P                          Pt is a 25 y.o. female with a history of endometriosis who presents to the emergency  department due to pelvic pain that began about 2 days ago.  Labs: CBC without abnormalities. CMP with an AST of 11. Lipase of 52. UA negative. Wet prep negative. GC/chlamydia is pending. Pregnancy test is negative.  Imaging: Pelvic ultrasound shows a 2.7 cm degenerating right ovarian corpus luteal cyst with associated trace free fluid within the pelvis.  Finding could contribute to underlying pelvic pain.  Small volume simple fluid within the endocervical canal which is nonspecific and they recommend correlation with patient's menstrual cycle.  Otherwise negative pelvic ultrasound.  No evidence for torsion or other acute abnormalities.  I, Placido Sou, PA-C, personally reviewed and evaluated these images and lab results as part of my medical decision-making.  Physical exam significant for diffuse lower abdominal pain.  Pelvic exam with bilateral adnexal tenderness but no cervical motion tenderness.  Doubt PID.  Otherwise reassuring pelvic exam.  I obtained an ultrasound with findings as noted above.    Lab work is mostly reassuring as well.  Lipase mildly elevated at 52.  CBC without leukocytosis.  Patient afebrile and not tachycardic.  Doubt infectious source at this time.  Feel the patient's symptoms today are likely secondary to her endometriosis versus an ovarian cyst.  Recommended that she follow-up with her OB/GYN tomorrow to schedule an appointment for reevaluation.  Will discharge on a short course of hydrocodone for breakthrough pain.  We discussed safety regarding this medication.    Feel that she is stable for discharge at this time and she is agreeable.  Her questions were answered  and she was amicable at the time of discharge.  Note: Portions of this report may have been transcribed using voice recognition software. Every effort was made to ensure accuracy; however, inadvertent computerized transcription errors may be present.   Final Clinical Impression(s) / ED Diagnoses Final diagnoses:  Pelvic pain  Right ovarian cyst   Rx / DC Orders ED Discharge Orders          Ordered    HYDROcodone-acetaminophen (NORCO/VICODIN) 5-325 MG tablet  Every 6 hours PRN        12/14/20 2010             Placido Sou, PA-C 12/14/20 2020    Terrilee Files, MD 12/14/20 2038

## 2021-08-26 ENCOUNTER — Encounter (HOSPITAL_BASED_OUTPATIENT_CLINIC_OR_DEPARTMENT_OTHER): Payer: Self-pay | Admitting: *Deleted

## 2021-08-26 ENCOUNTER — Emergency Department (HOSPITAL_BASED_OUTPATIENT_CLINIC_OR_DEPARTMENT_OTHER): Payer: BC Managed Care – PPO

## 2021-08-26 ENCOUNTER — Other Ambulatory Visit: Payer: Self-pay

## 2021-08-26 ENCOUNTER — Emergency Department (HOSPITAL_BASED_OUTPATIENT_CLINIC_OR_DEPARTMENT_OTHER)
Admission: EM | Admit: 2021-08-26 | Discharge: 2021-08-26 | Disposition: A | Discharge status: Home / Self Care | Payer: BC Managed Care – PPO | Attending: Emergency Medicine | Admitting: Emergency Medicine

## 2021-08-26 DIAGNOSIS — K529 Noninfective gastroenteritis and colitis, unspecified: Secondary | ICD-10-CM | POA: Diagnosis not present

## 2021-08-26 DIAGNOSIS — R1031 Right lower quadrant pain: Secondary | ICD-10-CM | POA: Diagnosis present

## 2021-08-26 LAB — CBC WITH DIFFERENTIAL/PLATELET
Abs Immature Granulocytes: 0.01 10*3/uL (ref 0.00–0.07)
Basophils Absolute: 0 10*3/uL (ref 0.0–0.1)
Basophils Relative: 0 %
Eosinophils Absolute: 0.2 10*3/uL (ref 0.0–0.5)
Eosinophils Relative: 4 %
HCT: 36.8 % (ref 36.0–46.0)
Hemoglobin: 12.4 g/dL (ref 12.0–15.0)
Immature Granulocytes: 0 %
Lymphocytes Relative: 17 %
Lymphs Abs: 0.8 10*3/uL (ref 0.7–4.0)
MCH: 30.9 pg (ref 26.0–34.0)
MCHC: 33.7 g/dL (ref 30.0–36.0)
MCV: 91.8 fL (ref 80.0–100.0)
Monocytes Absolute: 0.3 10*3/uL (ref 0.1–1.0)
Monocytes Relative: 7 %
Neutro Abs: 3.4 10*3/uL (ref 1.7–7.7)
Neutrophils Relative %: 72 %
Platelets: 169 10*3/uL (ref 150–400)
RBC: 4.01 MIL/uL (ref 3.87–5.11)
RDW: 12.3 % (ref 11.5–15.5)
WBC: 4.8 10*3/uL (ref 4.0–10.5)
nRBC: 0 % (ref 0.0–0.2)

## 2021-08-26 LAB — URINALYSIS, ROUTINE W REFLEX MICROSCOPIC
Bilirubin Urine: NEGATIVE
Glucose, UA: NEGATIVE mg/dL
Hgb urine dipstick: NEGATIVE
Ketones, ur: NEGATIVE mg/dL
Leukocytes,Ua: NEGATIVE
Nitrite: NEGATIVE
Protein, ur: NEGATIVE mg/dL
Specific Gravity, Urine: 1.007 (ref 1.005–1.030)
pH: 6 (ref 5.0–8.0)

## 2021-08-26 LAB — COMPREHENSIVE METABOLIC PANEL
ALT: 16 U/L (ref 0–44)
AST: 21 U/L (ref 15–41)
Albumin: 4.7 g/dL (ref 3.5–5.0)
Alkaline Phosphatase: 45 U/L (ref 38–126)
Anion gap: 9 (ref 5–15)
BUN: 6 mg/dL (ref 6–20)
CO2: 25 mmol/L (ref 22–32)
Calcium: 9.5 mg/dL (ref 8.9–10.3)
Chloride: 105 mmol/L (ref 98–111)
Creatinine, Ser: 0.83 mg/dL (ref 0.44–1.00)
GFR, Estimated: >60 mL/min (ref >60)
Glucose, Bld: 90 mg/dL (ref 70–99)
Potassium: 3.5 mmol/L (ref 3.5–5.1)
Sodium: 139 mmol/L (ref 135–145)
Total Bilirubin: 0.7 mg/dL (ref 0.3–1.2)
Total Protein: 7.3 g/dL (ref 6.5–8.1)

## 2021-08-26 LAB — PREGNANCY, URINE: Preg Test, Ur: NEGATIVE

## 2021-08-26 LAB — LIPASE, BLOOD: Lipase: 37 U/L (ref 11–51)

## 2021-08-26 MED ORDER — ONDANSETRON HCL 4 MG/2ML IJ SOLN
4.0000 mg | Freq: Once | INTRAMUSCULAR | Status: AC
  Administered 2021-08-26: 4 mg via INTRAVENOUS
  Filled 2021-08-26: qty 2

## 2021-08-26 MED ORDER — MORPHINE SULFATE (PF) 4 MG/ML IV SOLN
4.0000 mg | Freq: Once | INTRAVENOUS | Status: AC
  Administered 2021-08-26: 4 mg via INTRAVENOUS
  Filled 2021-08-26: qty 1

## 2021-08-26 MED ORDER — METOCLOPRAMIDE HCL 5 MG/ML IJ SOLN
5.0000 mg | Freq: Once | INTRAMUSCULAR | Status: AC
  Administered 2021-08-26: 5 mg via INTRAVENOUS
  Filled 2021-08-26: qty 2

## 2021-08-26 MED ORDER — IOHEXOL 300 MG/ML  SOLN
100.0000 mL | Freq: Once | INTRAMUSCULAR | Status: AC | PRN
  Administered 2021-08-26: 80 mL via INTRAVENOUS

## 2021-08-26 MED ORDER — LOPERAMIDE HCL 2 MG PO CAPS: 2.0000 mg | ORAL_CAPSULE | Freq: Four times a day (QID) | ORAL | 0 refills | Status: DC | PRN

## 2021-08-26 MED ORDER — ONDANSETRON 4 MG PO TBDP: 4.0000 mg | ORAL_TABLET | Freq: Three times a day (TID) | ORAL | 0 refills | Status: DC | PRN

## 2021-08-26 MED ORDER — KETOROLAC TROMETHAMINE 15 MG/ML IJ SOLN
15.0000 mg | Freq: Once | INTRAMUSCULAR | Status: AC
  Administered 2021-08-26: 15 mg via INTRAVENOUS
  Filled 2021-08-26: qty 1

## 2021-08-26 MED ORDER — SODIUM CHLORIDE 0.9 % IV BOLUS
1000.0000 mL | Freq: Once | INTRAVENOUS | Status: AC
  Administered 2021-08-26: 1000 mL via INTRAVENOUS

## 2021-08-26 NOTE — ED Notes (Signed)
Water and crackers at bedside for PO challenge 

## 2021-08-26 NOTE — ED Provider Notes (Signed)
[facility] EMERGENCY DEPT Provider Note   CSN: [id number] Arrival date & time: 08/26/21  1419     History  Chief Complaint  Patient presents with   Abdominal Pain    Summer Pruitt is a 26 y.o. female with a past medical history of endometriosis, anxiety, depression and nephrolithiasis presenting today with right lower quadrant and back pain.  Patient was seen by PCP who sent her to the ED for rule out of appendicitis.  Since Monday she has been having this abdominal pain that is worsening.  Associated vomiting and diarrhea.  Has not had any solid stools.  No longer eating.  Denies vaginal or urinary symptoms.  Last menstrual period 4/20.  No changes in medications or diet.  She does endorse taking multiple BC powders a week for headaches and caffeine.  No dark stools, hematochezia or melena.   Abdominal Pain Associated symptoms: diarrhea, nausea and vomiting       Home Medications Prior to Admission medications   Medication Sig Start Date End Date Taking? Authorizing Provider  acetaminophen (TYLENOL) 325 MG tablet Take 650 mg by mouth every 6 (six) hours as needed for moderate pain.    [provider]  albuterol (PROAIR HFA) 108 (90 Base) MCG/ACT inhaler Inhale 1 puff into the lungs every 6 (six) hours as needed for shortness of breath. 04/25/18   [provider]  ALPRAZolam Duanne Moron) 0.5 MG tablet Take 0.5 mg by mouth 2 (two) times daily as needed for anxiety. 01/13/17   [provider]  amphetamine-dextroamphetamine (ADDERALL) 10 MG tablet Take 10 mg by mouth 2 (two) times daily. 02/17/18   [provider]  EPINEPHrine 0.3 mg/0.3 mL IJ SOAJ injection Inject 0.3 mg into the muscle as needed for anaphylaxis.     [provider]  HYDROcodone-acetaminophen (NORCO/VICODIN) 5-325 MG tablet Take 1 tablet by mouth every 6 (six) hours as needed. 12/14/20   Rayna Sexton, PA-C  ibuprofen (ADVIL) 200 MG tablet Take 600 mg by mouth  every 6 (six) hours as needed for moderate pain.    [provider]  ketorolac (TORADOL) 10 MG tablet Take 1 tablet (10 mg total) by mouth every 6 (six) hours as needed. 10/14/19   Kinnie Feil, PA-C  norelgestromin-ethinyl estradiol (ZAFEMY) 150-35 MCG/24HR transdermal patch Place 1 patch onto the skin once a week.    [provider]  ondansetron (ZOFRAN ODT) 4 MG disintegrating tablet Take 1 tablet (4 mg total) by mouth every 8 (eight) hours as needed for nausea or vomiting. 10/14/19   Kinnie Feil, PA-C      Allergies    Amoxicillin, Doxycycline, Zithromax [azithromycin], and Cefdinir    Review of Systems   Review of Systems  Gastrointestinal:  Positive for abdominal pain, diarrhea, nausea and vomiting.   Physical Exam Updated Vital Signs BP 124/69 (BP Location: Right Arm)   Pulse (!) 104   Temp 97.7 F (36.5 C) (Oral)   Resp 14   Wt 67.1 kg   LMP 07/29/2021 (Exact Date)   SpO2 100%   BMI 24.63 kg/m  Physical Exam Vitals and nursing note reviewed.  Constitutional:      Appearance: Normal appearance. She is not ill-appearing.  HENT:     Head: Normocephalic and atraumatic.  Eyes:     General: No scleral icterus.    Conjunctiva/sclera: Conjunctivae normal.  Pulmonary:     Effort: Pulmonary effort is normal. No respiratory distress.  Abdominal:     General: Abdomen  is flat.     Palpations: Abdomen is soft.     Tenderness: There is abdominal tenderness in the right lower quadrant. There is no right CVA tenderness or left CVA tenderness.     Hernia: No hernia is present.  Skin:    Findings: No rash.  Neurological:     Mental Status: She is alert.  Psychiatric:        Mood and Affect: Mood normal.    ED Results / Procedures / Treatments   Labs (all labs ordered are listed, but only abnormal results are displayed) Labs Reviewed  URINALYSIS, ROUTINE W REFLEX MICROSCOPIC - Abnormal; Notable for the following components:      Result Value    Color, Urine COLORLESS (*)    All other components within normal limits  COMPREHENSIVE METABOLIC PANEL  LIPASE, BLOOD  CBC WITH DIFFERENTIAL/PLATELET  PREGNANCY, URINE    EKG None  Radiology CT ABDOMEN PELVIS W CONTRAST  Result Date: 08/26/2021 CLINICAL DATA:  Right lower quadrant pain. EXAM: CT ABDOMEN AND PELVIS WITH CONTRAST TECHNIQUE: Multidetector CT imaging of the abdomen and pelvis was performed using the standard protocol following bolus administration of intravenous contrast. RADIATION DOSE REDUCTION: This exam was performed according to the departmental dose-optimization program which includes automated exposure control, adjustment of the mA and/or kV according to patient size and/or use of iterative reconstruction technique. CONTRAST:  46mL OMNIPAQUE IOHEXOL 300 MG/ML  SOLN COMPARISON:  CT abdomen and pelvis 04/14/2019 FINDINGS: Lower chest: No acute abnormality. Hepatobiliary: No focal liver abnormality is seen. No gallstones, gallbladder wall thickening, or biliary dilatation. Pancreas: Unremarkable. No pancreatic ductal dilatation or surrounding inflammatory changes. Spleen: Normal in size without focal abnormality. Adrenals/Urinary Tract: Adrenal glands are unremarkable. Kidneys are normal, without renal calculi, focal lesion, or hydronephrosis. Bladder is unremarkable. Stomach/Bowel: Stomach is within normal limits. Appendix appears normal. There is mild diffuse colonic wall thickening versus normal under distension. There is no surrounding inflammation. No dilated bowel loops. Vascular/Lymphatic: No significant vascular findings are present. No enlarged abdominal or pelvic lymph nodes. Reproductive: Uterus and bilateral adnexa are unremarkable. Other: There is a small amount of free fluid in the pelvis. No focal abdominal wall hernia. Musculoskeletal: No acute or significant osseous findings. IMPRESSION: 1. Small amount of free fluid in the pelvis may be physiologic. 2. Diffuse colonic  wall thickening versus normal under distension. Correlate clinically for mild nonspecific colitis. 3. Appendix appears within normal limits. Electronically Signed   By: Ronney Asters M.D.   On: 08/26/2021 17:01    Procedures Procedures   Medications Ordered in ED Medications  metoCLOPramide (REGLAN) injection 5 mg (has no administration in time range)  ketorolac (TORADOL) 15 MG/ML injection 15 mg (has no administration in time range)  sodium chloride 0.9 % bolus 1,000 mL (0 mLs Intravenous Stopped 08/26/21 1547)  morphine (PF) 4 MG/ML injection 4 mg (4 mg Intravenous Given 08/26/21 1458)  ondansetron (ZOFRAN) injection 4 mg (4 mg Intravenous Given 08/26/21 1458)  iohexol (OMNIPAQUE) 300 MG/ML solution 100 mL (80 mLs Intravenous Contrast Given 08/26/21 1620)    ED Course/ Medical Decision Making/ A&P                           Medical Decision Making Amount and/or Complexity of Data Reviewed Labs: ordered. Radiology: ordered.  Risk Prescription drug management.   This patient presents to the ED for concern of abd pain. The differential diagnosis for abdominal pain includes, but is  not limited to AAA, gastroenteritis, appendicitis, Bowel obstruction, Bowel perforation. Gastroparesis, DKA, Hernia, Inflammatory bowel disease, mesenteric ischemia, pancreatitis, peritonitis SBP, volvulus.     This is not an exhaustive differential.    Past Medical History / Co-morbidities / Social History: endometriosis and nephrolithiasis   Additional history: Additional history obtained from external chart review.  Patient saw her PCP through [facility] here for appendicitis rule out.   Physical Exam: Physical exam performed. The pertinent findings include: RUQ and RLQ TTP  Lab Tests: I ordered, and personally interpreted labs.  The pertinent results include:  -Normal white count   Imaging Studies: I ordered imaging studies including CTAP. I independently visualized  and interpreted imaging which showed colitis. I agree with the radiologist interpretation.  Medications: I ordered medication including zofran and morphine. Reevaluation of the patient after these medicines showed that the patient improved.  After CT scanning patient reported a return of her symptoms.  Toradol and Reglan ordered at that time.  She reports her symptoms have improved.    Disposition: After consideration of the diagnostic results and the patients response to treatment, I feel that the patient does not require admission for intervention today.  She had a thorough GI work-up with no findings.  CT scan were consistent with colitis.  It has only been going on for 2 days, doubt bacterial etiology.  Additionally, afebrile with a nonconcerning white count.  No signs of appendicitis as considered by PCP.  She did have 1 episode of hypotension however blood pressure at this time is electrolytes were in normal limits and patient was hydrated with a bolus of normal saline.  Reports a pain level of 0.    I have sent Zofran and Imodium to the patient's pharmacy.  She says she has used these before and had no reaction.  She is already established with a gastroenterologist and she may follow-up with them.  They told her she will likely need a colonoscopy of these type of events keep happening.  Passed p.o. challenge in the department today and ambulated out in stable condition  Final Clinical Impression(s) / ED Diagnoses Final diagnoses:  Colitis    Rx / DC Orders ED Discharge Orders          Ordered    ondansetron (ZOFRAN ODT) 4 MG disintegrating tablet  Every 8 hours PRN        08/26/21 1717    loperamide (IMODIUM) 2 MG capsule  4 times daily PRN        08/26/21 1717           Results and diagnoses were explained to the patient. Return precautions discussed in full. Patient had no additional questions and expressed complete understanding.   This chart was dictated using voice  recognition software.  Despite best efforts to proofread,  errors can occur which can change the documentation meaning.    Darliss Ridgel 08/26/21 Zara Chess, MD 08/28/21 1355

## 2021-08-26 NOTE — ED Notes (Signed)
Pt ambulated to bathroom and back without incident.

## 2021-08-26 NOTE — ED Triage Notes (Addendum)
Pt is here for RLQ pain which began Monday as back and side pain and was associated with nausea and vomiting.  Yesterday she began having diarrhea also. Pt was seen at PCP and sent here for r/o appendicitis.

## 2021-08-26 NOTE — Discharge Instructions (Addendum)
Follow-up with your gastroenterologist as needed.  You do need to see them if these episodes recur.

## 2022-05-04 LAB — OB RESULTS CONSOLE RUBELLA ANTIBODY, IGM: Rubella: IMMUNE

## 2022-05-04 LAB — OB RESULTS CONSOLE HIV ANTIBODY (ROUTINE TESTING): HIV: NONREACTIVE

## 2022-05-04 LAB — OB RESULTS CONSOLE ABO/RH: RH Type: POSITIVE

## 2022-05-04 LAB — OB RESULTS CONSOLE RPR: RPR: NONREACTIVE

## 2022-05-04 LAB — OB RESULTS CONSOLE HEPATITIS B SURFACE ANTIGEN: Hepatitis B Surface Ag: NEGATIVE

## 2022-05-04 LAB — HEPATITIS C ANTIBODY: HCV Ab: NEGATIVE

## 2022-05-04 LAB — OB RESULTS CONSOLE ANTIBODY SCREEN: Antibody Screen: NEGATIVE

## 2022-09-15 LAB — OB RESULTS CONSOLE HIV ANTIBODY (ROUTINE TESTING): HIV: NONREACTIVE

## 2022-09-15 LAB — OB RESULTS CONSOLE RPR: RPR: NONREACTIVE

## 2022-11-18 IMAGING — CT CT ABD-PELV W/ CM
2 of 4 series · 16 of 46 positions shown, 18 images · IV contrast (APPLIED)
Comparison: CT abdomen and pelvis 04/14/2019

CLINICAL DATA: Right lower quadrant pain.

EXAM:
CT ABDOMEN AND PELVIS WITH CONTRAST
TECHNIQUE: Multidetector CT imaging of the abdomen and pelvis was performed
using the standard protocol following bolus administration of
intravenous contrast.

[Series 2: abd pel w · axial · 0.77mm/px · z∈[+715,+1110]mm · 13 of 87 slices shown, 15 images]
[im 4/87  soft-tissue]
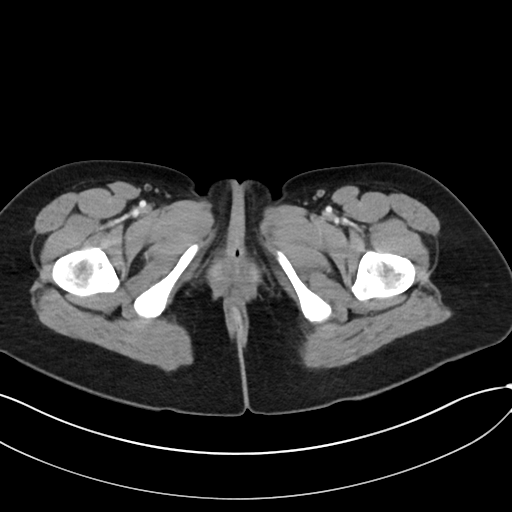
[im 4/87  bone]
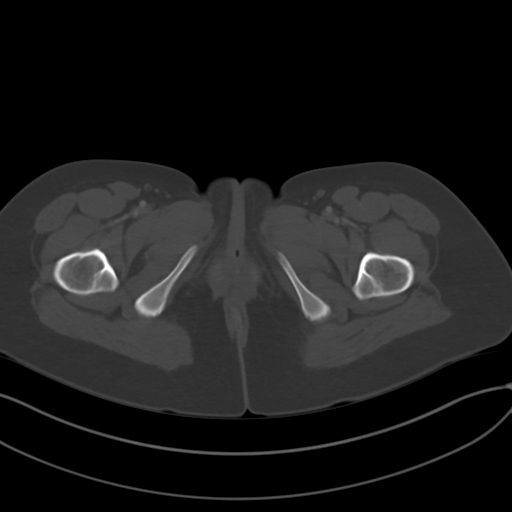
[im 10/87  soft-tissue]
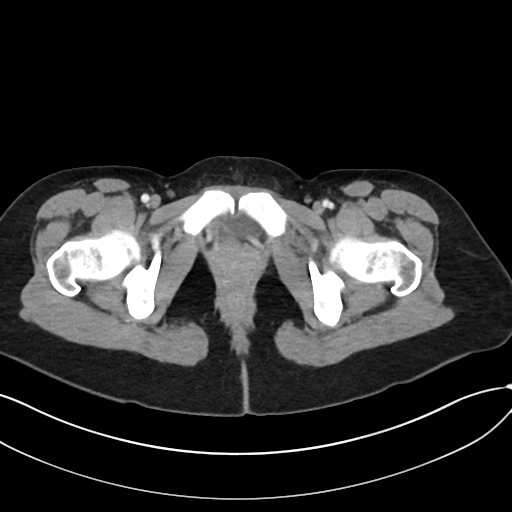
[im 17/87  soft-tissue]
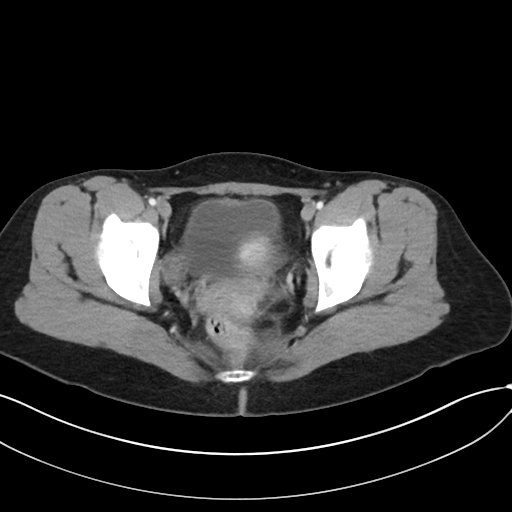
[im 24/87  soft-tissue]
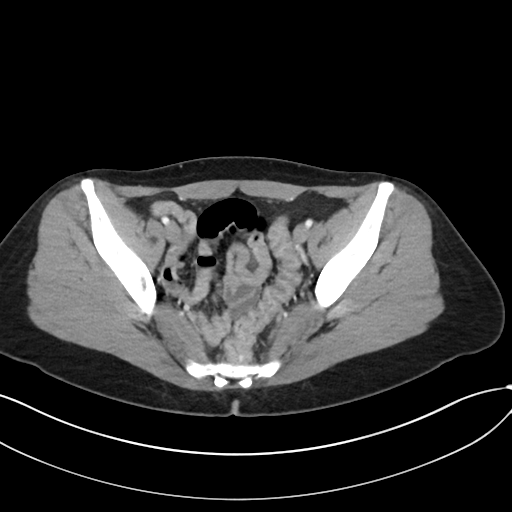
[im 30/87  soft-tissue]
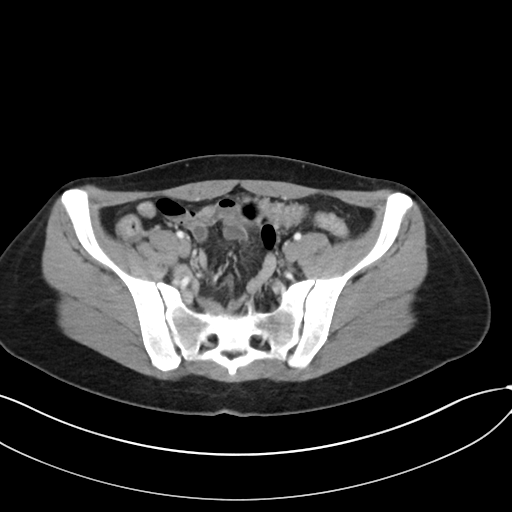
[im 37/87  soft-tissue]
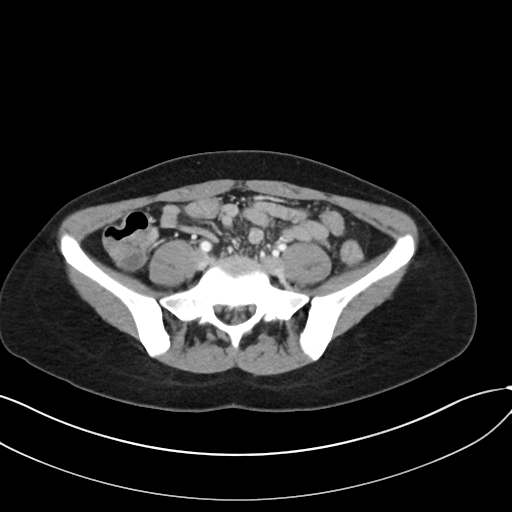
[im 44/87  soft-tissue]
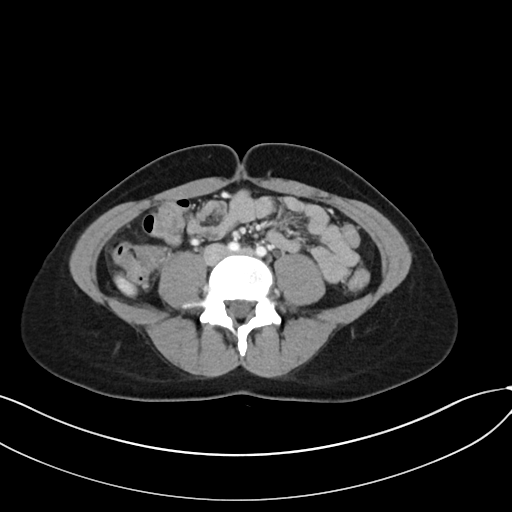
[im 50/87  soft-tissue]
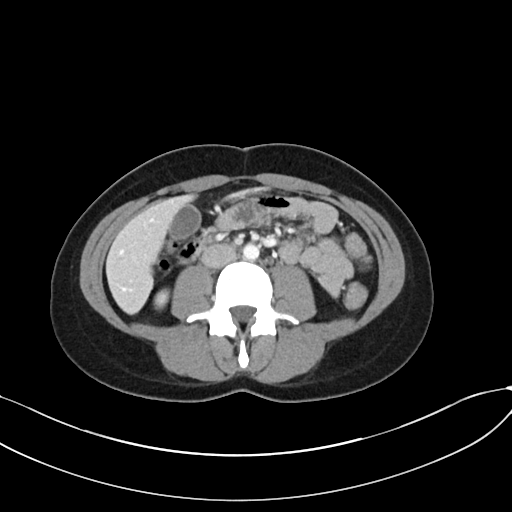
[im 57/87  soft-tissue]
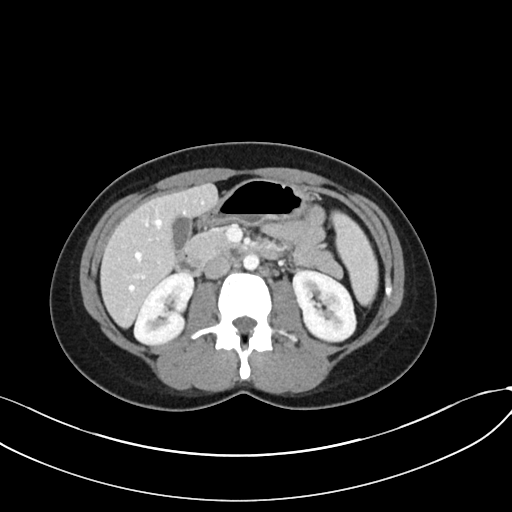
[im 57/87  bone]
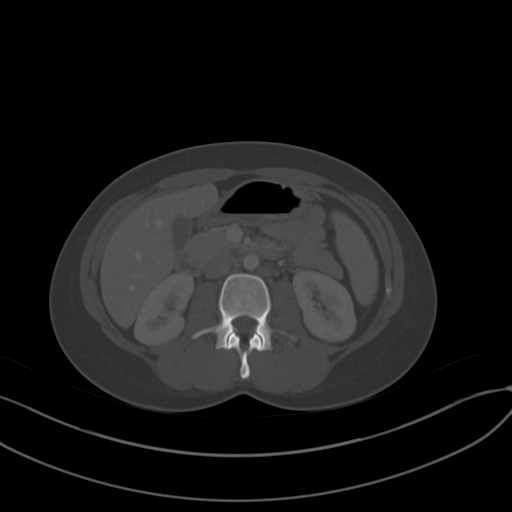
[im 63/87  soft-tissue]
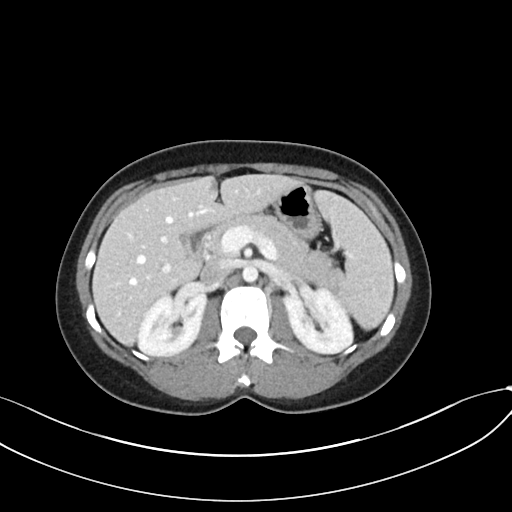
[im 70/87  soft-tissue]
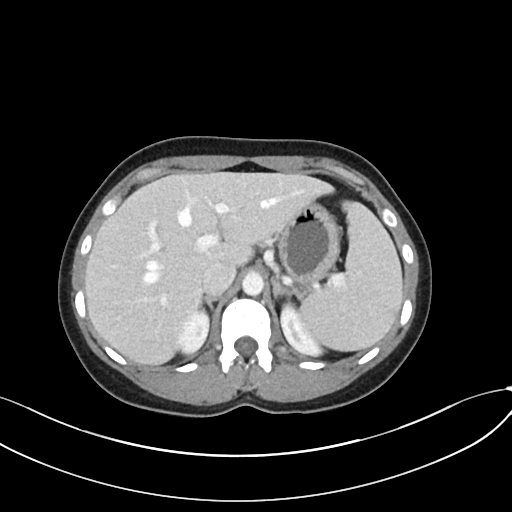
[im 77/87  soft-tissue]
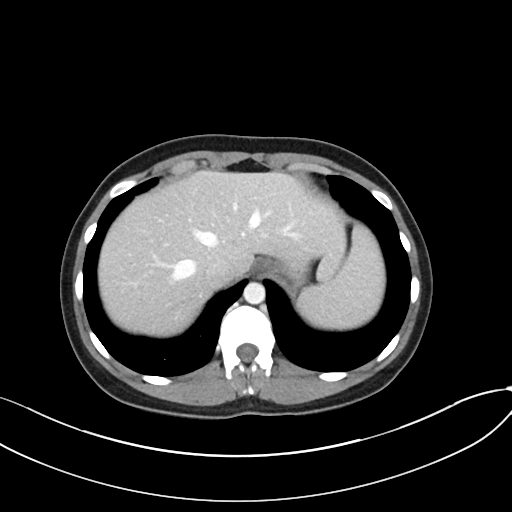
[im 83/87  soft-tissue]
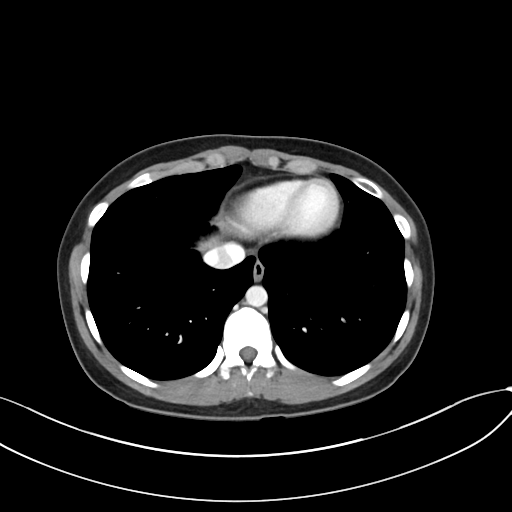

[Series 5: coronal · coronal · 0.81mm/px · 3 of 77 slices shown]
[im 26/77  soft-tissue]
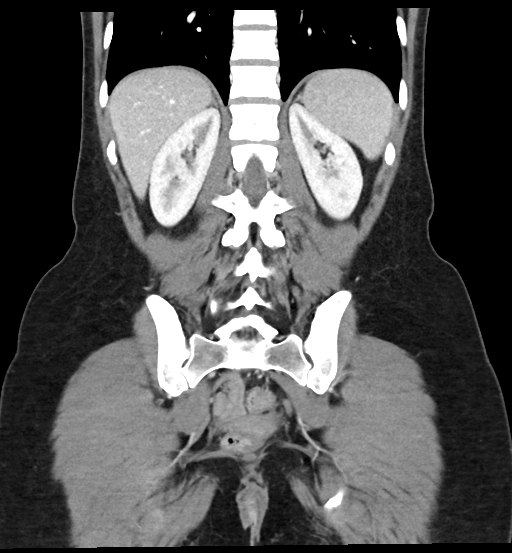
[im 34/77  soft-tissue]
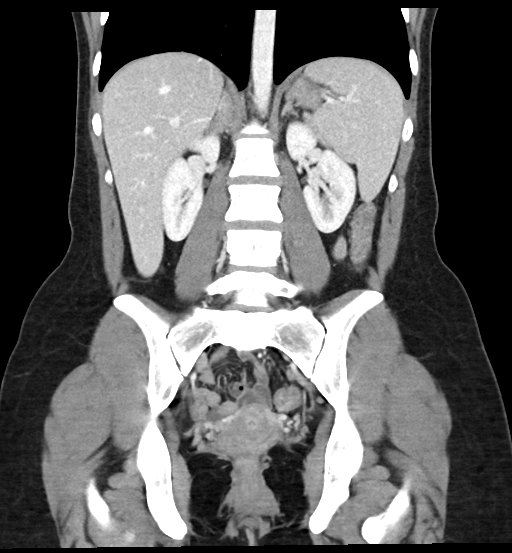
[im 43/77  soft-tissue]
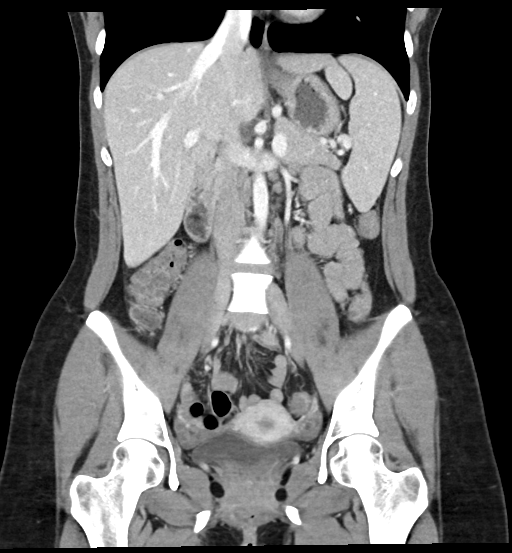

[16 of 46 positions shown; findings below may reference images not displayed]

RADIATION DOSE REDUCTION: This exam was performed according to the
departmental dose-optimization program which includes automated
exposure control, adjustment of the mA and/or kV according to
patient size and/or use of iterative reconstruction technique.

CONTRAST:  80mL OMNIPAQUE IOHEXOL 300 MG/ML  SOLN
FINDINGS: Lower chest: No acute abnormality.

Hepatobiliary: No focal liver abnormality is seen. No gallstones,
gallbladder wall thickening, or biliary dilatation.

Pancreas: Unremarkable. No pancreatic ductal dilatation or
surrounding inflammatory changes.

Spleen: Normal in size without focal abnormality.

Adrenals/Urinary Tract: Adrenal glands are unremarkable. Kidneys are
normal, without renal calculi, focal lesion, or hydronephrosis.
Bladder is unremarkable.

Stomach/Bowel: Stomach is within normal limits. Appendix appears
normal. There is mild diffuse colonic wall thickening versus normal
under distension. There is no surrounding inflammation. No dilated
bowel loops.

Vascular/Lymphatic: No significant vascular findings are present. No
enlarged abdominal or pelvic lymph nodes.

Reproductive: Uterus and bilateral adnexa are unremarkable.

Other: There is a small amount of free fluid in the pelvis. No focal
abdominal wall hernia.

Musculoskeletal: No acute or significant osseous findings.
IMPRESSION: 1. Small amount of free fluid in the pelvis may be physiologic.
2. Diffuse colonic wall thickening versus normal under distension.
Correlate clinically for mild nonspecific colitis.
3. Appendix appears within normal limits.

## 2022-11-25 LAB — OB RESULTS CONSOLE GBS: GBS: NEGATIVE

## 2022-11-25 LAB — OB RESULTS CONSOLE GC/CHLAMYDIA
Chlamydia: NEGATIVE
Neisseria Gonorrhea: NEGATIVE

## 2022-12-12 ENCOUNTER — Inpatient Hospital Stay (HOSPITAL_COMMUNITY)
Admission: AD | Admit: 2022-12-12 | Discharge: 2022-12-12 | Disposition: A | Discharge status: Home / Self Care | Payer: Medicaid Other | Attending: Obstetrics & Gynecology | Admitting: Obstetrics & Gynecology

## 2022-12-12 ENCOUNTER — Encounter (HOSPITAL_COMMUNITY): Payer: Self-pay | Admitting: Obstetrics & Gynecology

## 2022-12-12 DIAGNOSIS — O479 False labor, unspecified: Secondary | ICD-10-CM

## 2022-12-12 DIAGNOSIS — O471 False labor at or after 37 completed weeks of gestation: Secondary | ICD-10-CM | POA: Insufficient documentation

## 2022-12-12 DIAGNOSIS — Z3A40 40 weeks gestation of pregnancy: Secondary | ICD-10-CM

## 2022-12-12 NOTE — MAU Note (Signed)
Pt says is here for UC's  Strong since - 130pm PNC- Dr Vincente Poli- VE- on Thurs 1 cm  Denies HSV GBS- neg  Is sch for an induction on Sat

## 2022-12-13 ENCOUNTER — Inpatient Hospital Stay (HOSPITAL_COMMUNITY)
Admission: AD | Admit: 2022-12-13 | Discharge: 2022-12-16 | DRG: 788 | Disposition: A | Discharge status: Home / Self Care | Payer: Medicaid Other | Attending: Obstetrics and Gynecology | Admitting: Obstetrics and Gynecology

## 2022-12-13 ENCOUNTER — Other Ambulatory Visit: Payer: Self-pay

## 2022-12-13 ENCOUNTER — Inpatient Hospital Stay (HOSPITAL_COMMUNITY): Payer: Medicaid Other | Admitting: Anesthesiology

## 2022-12-13 ENCOUNTER — Encounter (HOSPITAL_COMMUNITY): Payer: Self-pay | Admitting: Obstetrics & Gynecology

## 2022-12-13 ENCOUNTER — Encounter (HOSPITAL_COMMUNITY)
Admission: AD | Disposition: A | Discharge status: Home / Self Care | Payer: Self-pay | Source: Home / Self Care | Attending: Obstetrics and Gynecology

## 2022-12-13 DIAGNOSIS — Z98891 History of uterine scar from previous surgery: Secondary | ICD-10-CM

## 2022-12-13 DIAGNOSIS — O99214 Obesity complicating childbirth: Secondary | ICD-10-CM | POA: Diagnosis present

## 2022-12-13 DIAGNOSIS — Z3A40 40 weeks gestation of pregnancy: Secondary | ICD-10-CM

## 2022-12-13 DIAGNOSIS — O480 Post-term pregnancy: Principal | ICD-10-CM | POA: Diagnosis present

## 2022-12-13 DIAGNOSIS — O471 False labor at or after 37 completed weeks of gestation: Secondary | ICD-10-CM | POA: Diagnosis present

## 2022-12-13 LAB — CBC
HCT: 36.2 % (ref 36.0–46.0)
Hemoglobin: 12.2 g/dL (ref 12.0–15.0)
MCH: 31 pg (ref 26.0–34.0)
MCHC: 33.7 g/dL (ref 30.0–36.0)
MCV: 91.9 fL (ref 80.0–100.0)
Platelets: 177 10*3/uL (ref 150–400)
RBC: 3.94 MIL/uL (ref 3.87–5.11)
RDW: 14.6 % (ref 11.5–15.5)
WBC: 15.3 10*3/uL — ABNORMAL HIGH (ref 4.0–10.5)
nRBC: 0 % (ref 0.0–0.2)

## 2022-12-13 LAB — TYPE AND SCREEN
ABO/RH(D): A POS
Antibody Screen: NEGATIVE

## 2022-12-13 LAB — RPR: RPR Ser Ql: NONREACTIVE

## 2022-12-13 SURGERY — Surgical Case
Anesthesia: Epidural

## 2022-12-13 MED ORDER — SODIUM CHLORIDE 0.9% FLUSH: 3.0000 mL | INTRAVENOUS | Status: DC | PRN

## 2022-12-13 MED ORDER — OXYCODONE HCL 5 MG PO TABS
5.0000 mg | ORAL_TABLET | ORAL | Status: DC | PRN
  Administered 2022-12-14: 10 mg via ORAL
  Administered 2022-12-14 (×2): 5 mg via ORAL
  Administered 2022-12-14 – 2022-12-15 (×3): 10 mg via ORAL
  Administered 2022-12-15: 5 mg via ORAL
  Administered 2022-12-15 – 2022-12-16 (×3): 10 mg via ORAL
  Filled 2022-12-13 (×4): qty 2
  Filled 2022-12-13: qty 1
  Filled 2022-12-13 (×2): qty 2
  Filled 2022-12-13: qty 1
  Filled 2022-12-13 (×2): qty 2

## 2022-12-13 MED ORDER — FENTANYL-BUPIVACAINE-NACL 0.5-0.125-0.9 MG/250ML-% EP SOLN
EPIDURAL | Status: DC | PRN
  Administered 2022-12-13: 12 mL/h via EPIDURAL

## 2022-12-13 MED ORDER — DIBUCAINE (PERIANAL) 1 % EX OINT: 1.0000 | TOPICAL_OINTMENT | CUTANEOUS | Status: DC | PRN

## 2022-12-13 MED ORDER — ACETAMINOPHEN 325 MG PO TABS
650.0000 mg | ORAL_TABLET | ORAL | Status: DC | PRN
  Administered 2022-12-13 – 2022-12-16 (×8): 650 mg via ORAL
  Filled 2022-12-13 (×8): qty 2

## 2022-12-13 MED ORDER — ZOLPIDEM TARTRATE 5 MG PO TABS: 5.0000 mg | ORAL_TABLET | Freq: Every evening | ORAL | Status: DC | PRN

## 2022-12-13 MED ORDER — WITCH HAZEL-GLYCERIN EX PADS: 1.0000 | MEDICATED_PAD | CUTANEOUS | Status: DC | PRN

## 2022-12-13 MED ORDER — OXYCODONE-ACETAMINOPHEN 5-325 MG PO TABS: 2.0000 | ORAL_TABLET | ORAL | Status: DC | PRN

## 2022-12-13 MED ORDER — EPHEDRINE 5 MG/ML INJ: 10.0000 mg | INTRAVENOUS | Status: DC | PRN

## 2022-12-13 MED ORDER — PHENYLEPHRINE HCL-NACL 20-0.9 MG/250ML-% IV SOLN
INTRAVENOUS | Status: AC
  Filled 2022-12-13: qty 250

## 2022-12-13 MED ORDER — OXYCODONE-ACETAMINOPHEN 5-325 MG PO TABS: 1.0000 | ORAL_TABLET | ORAL | Status: DC | PRN

## 2022-12-13 MED ORDER — LACTATED RINGERS IV BOLUS
500.0000 mL | Freq: Once | INTRAVENOUS | Status: AC
  Administered 2022-12-13: 500 mL via INTRAVENOUS

## 2022-12-13 MED ORDER — MENTHOL 3 MG MT LOZG: 1.0000 | LOZENGE | OROMUCOSAL | Status: DC | PRN

## 2022-12-13 MED ORDER — ONDANSETRON HCL 4 MG/2ML IJ SOLN
INTRAMUSCULAR | Status: AC
  Filled 2022-12-13: qty 2

## 2022-12-13 MED ORDER — ONDANSETRON HCL 4 MG/2ML IJ SOLN: 4.0000 mg | Freq: Four times a day (QID) | INTRAMUSCULAR | Status: DC | PRN

## 2022-12-13 MED ORDER — SENNOSIDES-DOCUSATE SODIUM 8.6-50 MG PO TABS
2.0000 | ORAL_TABLET | Freq: Every day | ORAL | Status: DC
  Administered 2022-12-14 – 2022-12-16 (×3): 2 via ORAL
  Filled 2022-12-13 (×3): qty 2

## 2022-12-13 MED ORDER — NALOXONE HCL 4 MG/10ML IJ SOLN
1.0000 ug/kg/h | INTRAVENOUS | Status: DC | PRN
  Filled 2022-12-13: qty 5

## 2022-12-13 MED ORDER — SIMETHICONE 80 MG PO CHEW
80.0000 mg | CHEWABLE_TABLET | Freq: Three times a day (TID) | ORAL | Status: DC
  Administered 2022-12-14 – 2022-12-16 (×7): 80 mg via ORAL
  Filled 2022-12-13 (×7): qty 1

## 2022-12-13 MED ORDER — NALOXONE HCL 0.4 MG/ML IJ SOLN: 0.4000 mg | INTRAMUSCULAR | Status: DC | PRN

## 2022-12-13 MED ORDER — DEXAMETHASONE SODIUM PHOSPHATE 10 MG/ML IJ SOLN
INTRAMUSCULAR | Status: DC | PRN
  Administered 2022-12-13: 5 mg via INTRAVENOUS

## 2022-12-13 MED ORDER — SCOPOLAMINE 1 MG/3DAYS TD PT72
1.0000 | MEDICATED_PATCH | Freq: Once | TRANSDERMAL | Status: AC
  Administered 2022-12-13: 1.5 mg via TRANSDERMAL
  Filled 2022-12-13: qty 1

## 2022-12-13 MED ORDER — FENTANYL CITRATE (PF) 100 MCG/2ML IJ SOLN
INTRAMUSCULAR | Status: AC
  Filled 2022-12-13: qty 2

## 2022-12-13 MED ORDER — OXYTOCIN-SODIUM CHLORIDE 30-0.9 UT/500ML-% IV SOLN
INTRAVENOUS | Status: AC
  Filled 2022-12-13: qty 500

## 2022-12-13 MED ORDER — FENTANYL-BUPIVACAINE-NACL 0.5-0.125-0.9 MG/250ML-% EP SOLN
12.0000 mL/h | EPIDURAL | Status: DC | PRN
  Filled 2022-12-13: qty 250

## 2022-12-13 MED ORDER — OXYTOCIN-SODIUM CHLORIDE 30-0.9 UT/500ML-% IV SOLN
INTRAVENOUS | Status: DC | PRN
  Administered 2022-12-13: 300 mL via INTRAVENOUS

## 2022-12-13 MED ORDER — KETOROLAC TROMETHAMINE 30 MG/ML IJ SOLN
INTRAMUSCULAR | Status: AC
  Filled 2022-12-13: qty 1

## 2022-12-13 MED ORDER — MEPERIDINE HCL 25 MG/ML IJ SOLN: 6.2500 mg | INTRAMUSCULAR | Status: DC | PRN

## 2022-12-13 MED ORDER — LIDOCAINE HCL (PF) 1 % IJ SOLN: 30.0000 mL | INTRAMUSCULAR | Status: DC | PRN

## 2022-12-13 MED ORDER — SODIUM CHLORIDE 0.9 % IR SOLN
Status: DC | PRN
  Administered 2022-12-13: 1000 mL

## 2022-12-13 MED ORDER — OXYTOCIN BOLUS FROM INFUSION: 333.0000 mL | Freq: Once | INTRAVENOUS | Status: DC

## 2022-12-13 MED ORDER — MORPHINE SULFATE (PF) 0.5 MG/ML IJ SOLN
INTRAMUSCULAR | Status: DC | PRN
  Administered 2022-12-13: 3 mg via EPIDURAL

## 2022-12-13 MED ORDER — PRENATAL MULTIVITAMIN CH
1.0000 | ORAL_TABLET | Freq: Every day | ORAL | Status: DC
  Administered 2022-12-14 – 2022-12-15 (×2): 1 via ORAL
  Filled 2022-12-13 (×2): qty 1

## 2022-12-13 MED ORDER — LACTATED RINGERS IV SOLN: 500.0000 mL | INTRAVENOUS | Status: DC | PRN

## 2022-12-13 MED ORDER — FENTANYL CITRATE (PF) 250 MCG/5ML IJ SOLN
INTRAMUSCULAR | Status: AC
  Filled 2022-12-13: qty 5

## 2022-12-13 MED ORDER — DIPHENHYDRAMINE HCL 50 MG/ML IJ SOLN: 12.5000 mg | INTRAMUSCULAR | Status: DC | PRN

## 2022-12-13 MED ORDER — LACTATED RINGERS IV SOLN: 500.0000 mL | Freq: Once | INTRAVENOUS | Status: DC

## 2022-12-13 MED ORDER — ONDANSETRON HCL 4 MG/2ML IJ SOLN
INTRAMUSCULAR | Status: DC | PRN
  Administered 2022-12-13: 4 mg via INTRAVENOUS

## 2022-12-13 MED ORDER — FENTANYL CITRATE (PF) 100 MCG/2ML IJ SOLN
INTRAMUSCULAR | Status: DC | PRN
  Administered 2022-12-13: 85 ug via EPIDURAL

## 2022-12-13 MED ORDER — SOD CITRATE-CITRIC ACID 500-334 MG/5ML PO SOLN: 30.0000 mL | ORAL | Status: DC | PRN

## 2022-12-13 MED ORDER — PHENYLEPHRINE 80 MCG/ML (10ML) SYRINGE FOR IV PUSH (FOR BLOOD PRESSURE SUPPORT): 80.0000 ug | PREFILLED_SYRINGE | INTRAVENOUS | Status: DC | PRN

## 2022-12-13 MED ORDER — SIMETHICONE 80 MG PO CHEW: 80.0000 mg | CHEWABLE_TABLET | ORAL | Status: DC | PRN

## 2022-12-13 MED ORDER — COCONUT OIL OIL: 1.0000 | TOPICAL_OIL | Status: DC | PRN

## 2022-12-13 MED ORDER — ACETAMINOPHEN 325 MG PO TABS
650.0000 mg | ORAL_TABLET | ORAL | Status: DC | PRN
  Administered 2022-12-13: 650 mg via ORAL
  Filled 2022-12-13: qty 2

## 2022-12-13 MED ORDER — DEXAMETHASONE SODIUM PHOSPHATE 10 MG/ML IJ SOLN
INTRAMUSCULAR | Status: AC
  Filled 2022-12-13: qty 1

## 2022-12-13 MED ORDER — STERILE WATER FOR IRRIGATION IR SOLN
Status: DC | PRN
  Administered 2022-12-13: 1000 mL

## 2022-12-13 MED ORDER — IBUPROFEN 600 MG PO TABS
600.0000 mg | ORAL_TABLET | Freq: Four times a day (QID) | ORAL | Status: DC
  Filled 2022-12-13: qty 1

## 2022-12-13 MED ORDER — IBUPROFEN 600 MG PO TABS
600.0000 mg | ORAL_TABLET | Freq: Four times a day (QID) | ORAL | Status: DC
  Administered 2022-12-13 – 2022-12-16 (×11): 600 mg via ORAL
  Filled 2022-12-13 (×10): qty 1

## 2022-12-13 MED ORDER — LACTATED RINGERS IV SOLN: INTRAVENOUS | Status: DC

## 2022-12-13 MED ORDER — KETOROLAC TROMETHAMINE 30 MG/ML IJ SOLN
30.0000 mg | Freq: Once | INTRAMUSCULAR | Status: AC | PRN
  Administered 2022-12-13: 30 mg via INTRAVENOUS

## 2022-12-13 MED ORDER — LIDOCAINE-EPINEPHRINE (PF) 2 %-1:200000 IJ SOLN
INTRAMUSCULAR | Status: DC | PRN
  Administered 2022-12-13 (×4): 5 mL

## 2022-12-13 MED ORDER — ACETAMINOPHEN 10 MG/ML IV SOLN
INTRAVENOUS | Status: AC
  Filled 2022-12-13: qty 100

## 2022-12-13 MED ORDER — LIDOCAINE-EPINEPHRINE (PF) 2 %-1:200000 IJ SOLN
INTRAMUSCULAR | Status: DC | PRN
Start: 2022-12-13 — End: 2022-12-13
  Administered 2022-12-13: 5 mL via EPIDURAL

## 2022-12-13 MED ORDER — OXYTOCIN-SODIUM CHLORIDE 30-0.9 UT/500ML-% IV SOLN
2.5000 [IU]/h | INTRAVENOUS | Status: AC
  Administered 2022-12-13: 2.5 [IU]/h via INTRAVENOUS
  Filled 2022-12-13: qty 500

## 2022-12-13 MED ORDER — DIPHENHYDRAMINE HCL 25 MG PO CAPS
25.0000 mg | ORAL_CAPSULE | ORAL | Status: DC | PRN
  Filled 2022-12-13: qty 1

## 2022-12-13 MED ORDER — OXYTOCIN-SODIUM CHLORIDE 30-0.9 UT/500ML-% IV SOLN: 2.5000 [IU]/h | INTRAVENOUS | Status: DC

## 2022-12-13 MED ORDER — DIPHENHYDRAMINE HCL 25 MG PO CAPS: 25.0000 mg | ORAL_CAPSULE | Freq: Four times a day (QID) | ORAL | Status: DC | PRN

## 2022-12-13 MED ORDER — MORPHINE SULFATE (PF) 0.5 MG/ML IJ SOLN
INTRAMUSCULAR | Status: AC
  Filled 2022-12-13: qty 10

## 2022-12-13 MED ORDER — CEFAZOLIN SODIUM-DEXTROSE 2-3 GM-%(50ML) IV SOLR
INTRAVENOUS | Status: DC | PRN
Start: 2022-12-13 — End: 2022-12-13
  Administered 2022-12-13: 2 g via INTRAVENOUS

## 2022-12-13 MED ORDER — ALBUTEROL SULFATE (2.5 MG/3ML) 0.083% IN NEBU: 2.5000 mg | INHALATION_SOLUTION | Freq: Four times a day (QID) | RESPIRATORY_TRACT | Status: DC | PRN

## 2022-12-13 MED ORDER — ONDANSETRON HCL 4 MG/2ML IJ SOLN: 4.0000 mg | Freq: Three times a day (TID) | INTRAMUSCULAR | Status: DC | PRN

## 2022-12-13 MED ORDER — LIDOCAINE HCL (PF) 1 % IJ SOLN
INTRAMUSCULAR | Status: DC | PRN
  Administered 2022-12-13: 10 mL via EPIDURAL
  Administered 2022-12-13: 2 mL via EPIDURAL

## 2022-12-13 MED ORDER — FLEET ENEMA RE ENEM: 1.0000 | ENEMA | RECTAL | Status: DC | PRN

## 2022-12-13 SURGICAL SUPPLY — 40 items
APL PRP STRL LF DISP 70% ISPRP (MISCELLANEOUS) ×1
APL SKNCLS STERI-STRIP NONHPOA (GAUZE/BANDAGES/DRESSINGS) ×1
BAG COUNTER SPONGE SURGICOUNT (BAG) ×1 IMPLANT
BAG SPNG CNTER NS LX DISP (BAG) ×1
BARRIER ADHS 3X4 INTERCEED (GAUZE/BANDAGES/DRESSINGS) IMPLANT
BENZOIN TINCTURE PRP APPL 2/3 (GAUZE/BANDAGES/DRESSINGS) IMPLANT
BRR ADH 4X3 ABS CNTRL BYND (GAUZE/BANDAGES/DRESSINGS)
CHLORAPREP W/TINT 26 (MISCELLANEOUS) ×1 IMPLANT
CLAMP CORD UMBIL (MISCELLANEOUS) IMPLANT
DRSG OPSITE POSTOP 4X10 (GAUZE/BANDAGES/DRESSINGS) ×1 IMPLANT
ELECT REM PT RETURN 9FT ADLT (ELECTROSURGICAL) ×1
ELECTRODE REM PT RTRN 9FT ADLT (ELECTROSURGICAL) ×1 IMPLANT
EXTRACTOR VACUUM M CUP 4 TUBE (SUCTIONS) IMPLANT
GAUZE SPONGE 4X4 12PLY STRL LF (GAUZE/BANDAGES/DRESSINGS) IMPLANT
GLOVE BIO SURGEON STRL SZ 6.5 (GLOVE) ×1 IMPLANT
GLOVE BIOGEL PI IND STRL 7.0 (GLOVE) ×1 IMPLANT
GOWN STRL REUS W/ TWL LRG LVL3 (GOWN DISPOSABLE) ×2 IMPLANT
GOWN STRL REUS W/TWL LRG LVL3 (GOWN DISPOSABLE) ×2
KIT ABG SYR 3ML LUER SLIP (SYRINGE) IMPLANT
KIT TURNOVER KIT B (KITS) ×1 IMPLANT
NDL HYPO 22X1.5 SAFETY MO (MISCELLANEOUS) IMPLANT
NDL HYPO 25X5/8 SAFETYGLIDE (NEEDLE) ×1 IMPLANT
NEEDLE HYPO 22X1.5 SAFETY MO (MISCELLANEOUS) IMPLANT
NEEDLE HYPO 25X5/8 SAFETYGLIDE (NEEDLE) ×1 IMPLANT
NS IRRIG 1000ML POUR BTL (IV SOLUTION) ×1 IMPLANT
PACK C SECTION WH (CUSTOM PROCEDURE TRAY) ×1 IMPLANT
PAD OB MATERNITY 4.3X12.25 (PERSONAL CARE ITEMS) ×1 IMPLANT
PENCIL SMOKE EVACUATOR (MISCELLANEOUS) ×1 IMPLANT
STRIP CLOSURE SKIN 1/2X4 (GAUZE/BANDAGES/DRESSINGS) IMPLANT
SUT CHROMIC 0 CTX 36 (SUTURE) ×2 IMPLANT
SUT PLAIN 0 NONE (SUTURE) IMPLANT
SUT PLAIN 2 0 XLH (SUTURE) IMPLANT
SUT VIC AB 0 CT1 27 (SUTURE) ×3
SUT VIC AB 0 CT1 27XBRD ANBCTR (SUTURE) ×3 IMPLANT
SUT VIC AB 4-0 KS 27 (SUTURE) IMPLANT
SYR CONTROL 10ML LL (SYRINGE) IMPLANT
TOWEL GREEN STERILE FF (TOWEL DISPOSABLE) ×1 IMPLANT
TRAY FOLEY W/BAG SLVR 14FR (SET/KITS/TRAYS/PACK) ×1 IMPLANT
VACUUM CUP M-STYLE MYSTIC II (SUCTIONS) IMPLANT
WATER STERILE IRR 1000ML POUR (IV SOLUTION) ×1 IMPLANT

## 2022-12-13 NOTE — Op Note (Signed)
NAMEJOHNNI, LUCATERO. MEDICAL RECORD NO: [id number] ACCOUNT NO: [id number] DATE OF BIRTH: 1995-07-03 FACILITY: [facility] LOCATION: [id number] PHYSICIAN: Fleetwood Pierron L. Vincente Poli, MD  Operative Report   DATE OF PROCEDURE: 12/13/2022  PREOPERATIVE DIAGNOSIS:  Intrauterine pregnancy at 40 weeks and 3 days, failure to progress and nonreassuring fetal heart rate pattern.  POSTOPERATIVE DIAGNOSIS:  Intrauterine pregnancy at 40 weeks and 3 days, failure to progress and nonreassuring fetal heart rate pattern.  PROCEDURE:  Primary low transverse cesarean section.  SURGEON:  Jeyda Siebel L. Natara Monfort, MD.  ANESTHESIA:  Epidural.  ESTIMATED BLOOD LOSS:  300 mL.  COMPLICATIONS:  None.  DRAINS:  Foley catheter.  DESCRIPTION OF PROCEDURE:  The patient was taken to the operating room after informed consent was obtained.  She was then prepped and draped in the usual sterile fashion.  Timeout was performed.  A low transverse incision was made, carried down to the  fascia.  The fascia was scored in the midline and extended laterally.  The rectus muscles were divided in the midline.  The peritoneum was entered bluntly.  The peritoneal incision was then stretched.  The bladder blade was inserted and the bladder flap  was created in standard fashion.  A low transverse incision was made in the uterus.  The uterus was entered using a hemostat.  The amniotic fluid was meconium stained.  The baby was in cephalic presentation was delivered easily, was a female infant, Apgars  8 at 1 minute and 9 at 5 minutes.  Cord was clamped and cut and the baby was handed to the waiting neonatal team.  The placenta was removed intact and was noted to have a 3-vessel cord.  The uterus was exteriorized and cleared of all clots and debris.   There was some uterine atony that responded to Pitocin and uterine massage.  The uterine incision was closed in 1 layer using 0 chromic in a running locked stitch.  The uterus was returned to the abdomen.   Irrigation was performed.  Hemostasis was very  good.  The peritoneum was closed using 0 Vicryl.  The fascia was closed using 0 Vicryl in a running stitch.  The subcutaneous was closed with plain gut in a running stitch.  The skin was closed with subcuticular stitch.  Benzoin, Steri-Strips and a  honeycomb dressing were applied.  All sponge, lap and instrument counts were correct x2.  The patient went to recovery room in stable condition.   PUS D: 12/13/2022 2:09:35 pm T: 12/13/2022 3:09:00 pm  JOB: [id number]/ [id number]

## 2022-12-13 NOTE — Anesthesia Procedure Notes (Signed)
Epidural Patient location during procedure: OB Start time: 12/13/2022 2:05 AM End time: 12/13/2022 2:12 AM  Staffing Anesthesiologist: Lannie Fields, DO Performed: anesthesiologist   Preanesthetic Checklist Completed: patient identified, IV checked, risks and benefits discussed, monitors and equipment checked, pre-op evaluation and timeout performed  Epidural Patient position: sitting Prep: DuraPrep and site prepped and draped Patient monitoring: continuous pulse ox, blood pressure, heart rate and cardiac monitor Approach: midline Location: L3-L4 Injection technique: LOR air  Needle:  Needle type: Tuohy  Needle gauge: 17 G Needle length: 9 cm Needle insertion depth: 5 cm Catheter type: closed end flexible Catheter size: 19 Gauge Catheter at skin depth: 10 cm Test dose: negative  Assessment Sensory level: T8 Events: blood not aspirated, no cerebrospinal fluid, injection not painful, no injection resistance, no paresthesia and negative IV test  Additional Notes Patient identified. Risks/Benefits/Options discussed with patient including but not limited to bleeding, infection, nerve damage, paralysis, failed block, incomplete pain control, headache, blood pressure changes, nausea, vomiting, reactions to medication both or allergic, itching and postpartum back pain. Confirmed with bedside nurse the patient's most recent platelet count. Confirmed with patient that they are not currently taking any anticoagulation, have any bleeding history or any family history of bleeding disorders. Patient expressed understanding and wished to proceed. All questions were answered. Sterile technique was used throughout the entire procedure. Please see nursing notes for vital signs. Test dose was given through epidural catheter and negative prior to continuing to dose epidural or start infusion. Warning signs of high block given to the patient including shortness of breath, tingling/numbness in  hands, complete motor block, or any concerning symptoms with instructions to call for help. Patient was given instructions on fall risk and not to get out of bed. All questions and concerns addressed with instructions to call with any issues or inadequate analgesia.  Reason for block:procedure for pain

## 2022-12-13 NOTE — Progress Notes (Signed)
Last 3 cervical exams no change per same nurse  Temp 100.8  Baseline up slightly  Cervix is 80% 7 cm -2  Vertex Cervix is swollen IUPC placed  I will recheck cervix in about an hour Findings reviewed with patient

## 2022-12-13 NOTE — Anesthesia Preprocedure Evaluation (Signed)
Anesthesia Evaluation  Patient identified by MRN, date of birth, ID band Patient awake    Reviewed: Allergy & Precautions, Patient's Chart, lab work & pertinent test results  History of Anesthesia Complications (+) PONV and history of anesthetic complications  Airway Mallampati: II  TM Distance: >3 FB Neck ROM: Full    Dental no notable dental hx.    Pulmonary neg pulmonary ROS   Pulmonary exam normal breath sounds clear to auscultation       Cardiovascular negative cardio ROS Normal cardiovascular exam Rhythm:Regular Rate:Normal     Neuro/Psych  PSYCHIATRIC DISORDERS Anxiety Depression    negative neurological ROS     GI/Hepatic negative GI ROS, Neg liver ROS,,,  Endo/Other  Obesity BMI 34  Renal/GU negative Renal ROS  negative genitourinary   Musculoskeletal negative musculoskeletal ROS (+)    Abdominal   Peds negative pediatric ROS (+)  Hematology negative hematology ROS (+) Hb 12.2, plt 177   Anesthesia Other Findings   Reproductive/Obstetrics (+) Pregnancy                             Anesthesia Physical Anesthesia Plan  ASA: 2  Anesthesia Plan: Epidural   Post-op Pain Management:    Induction:   PONV Risk Score and Plan: 2  Airway Management Planned: Natural Airway  Additional Equipment: None  Intra-op Plan:   Post-operative Plan:   Informed Consent: I have reviewed the patients History and Physical, chart, labs and discussed the procedure including the risks, benefits and alternatives for the proposed anesthesia with the patient or authorized representative who has indicated his/her understanding and acceptance.       Plan Discussed with:   Anesthesia Plan Comments:        Anesthesia Quick Evaluation

## 2022-12-13 NOTE — MAU Note (Signed)
.  Summer Pruitt is a 27 y.o. at [redacted]w[redacted]d here in MAU reporting: just left MAU but came back due to ctxs becoming stronger.  No LOF, no bleeding, +FM.     Pain score: 10/10 lower abdominal ctxs  Vitals:   12/13/22 0105  BP: (!) 111/58  Resp: 19  Temp: 97.9 F (36.6 C)     FHT:150 Lab orders placed from triage:   Labor eval

## 2022-12-13 NOTE — Lactation Note (Signed)
This note was copied from a baby's chart. Lactation Consultation Note  Patient Name: Summer Pruitt [id number] Date: 12/13/2022 Age:26 hours Reason for consult: Initial assessment;1st time breastfeeding;Term  P1- MOB had 4 family members in room when [facility] entered. MOB asked LC to come back later tonight or tomorrow.  FOB stated that infant had latched and nursed for 15 minutes at 1600.  LC encouraged MOB to call lactation team when she is ready to be seen.  Consult Status Consult Status: Follow-up Date: 12/14/22 Follow-up type: In-patient    Dema Severin IBCLC, BS 12/13/2022, 6:20 PM

## 2022-12-13 NOTE — Plan of Care (Signed)
Problem: Education: Goal: Knowledge of Childbirth will improve Outcome: Progressing Goal: Ability to make informed decisions regarding treatment and plan of care will improve Outcome: Progressing Goal: Ability to state and carry out methods to decrease the pain will improve Outcome: Progressing Goal: Individualized Educational Video(s) Outcome: Progressing   Problem: Coping: Goal: Ability to verbalize concerns and feelings about labor and delivery will improve Outcome: Progressing   Problem: Life Cycle: Goal: Ability to make normal progression through stages of labor will improve Outcome: Progressing Goal: Ability to effectively push during vaginal delivery will improve Outcome: Progressing   Problem: Role Relationship: Goal: Will demonstrate positive interactions with the child Outcome: Progressing   Problem: Safety: Goal: Risk of complications during labor and delivery will decrease Outcome: Progressing   Problem: Pain Management: Goal: Relief or control of pain from uterine contractions will improve Outcome: Progressing   Problem: Education: Goal: Knowledge of General Education information will improve Description: Including pain rating scale, medication(s)/side effects and non-pharmacologic comfort measures Outcome: Progressing   Problem: Health Behavior/Discharge Planning: Goal: Ability to manage health-related needs will improve Outcome: Progressing   Problem: Clinical Measurements: Goal: Ability to maintain clinical measurements within normal limits will improve Outcome: Progressing Goal: Will remain free from infection Outcome: Progressing Goal: Diagnostic test results will improve Outcome: Progressing Goal: Respiratory complications will improve Outcome: Progressing Goal: Cardiovascular complication will be avoided Outcome: Progressing   Problem: Activity: Goal: Risk for activity intolerance will decrease Outcome: Progressing   Problem:  Nutrition: Goal: Adequate nutrition will be maintained Outcome: Progressing   Problem: Coping: Goal: Level of anxiety will decrease Outcome: Progressing   Problem: Elimination: Goal: Will not experience complications related to bowel motility Outcome: Progressing Goal: Will not experience complications related to urinary retention Outcome: Progressing   Problem: Pain Managment: Goal: General experience of comfort will improve Outcome: Progressing   Problem: Safety: Goal: Ability to remain free from injury will improve Outcome: Progressing   Problem: Skin Integrity: Goal: Risk for impaired skin integrity will decrease Outcome: Progressing   Problem: Education: Goal: Knowledge of the prescribed therapeutic regimen will improve Outcome: Progressing Goal: Understanding of sexual limitations or changes related to disease process or condition will improve Outcome: Progressing Goal: Individualized Educational Video(s) Outcome: Progressing   Problem: Self-Concept: Goal: Communication of feelings regarding changes in body function or appearance will improve Outcome: Progressing   Problem: Skin Integrity: Goal: Demonstration of wound healing without infection will improve Outcome: Progressing   Problem: Education: Goal: Knowledge of condition will improve Outcome: Progressing Goal: Individualized Educational Video(s) Outcome: Progressing Goal: Individualized Newborn Educational Video(s) Outcome: Progressing   Problem: Activity: Goal: Will verbalize the importance of balancing activity with adequate rest periods Outcome: Progressing Goal: Ability to tolerate increased activity will improve Outcome: Progressing   Problem: Coping: Goal: Ability to identify and utilize available resources and services will improve Outcome: Progressing   Problem: Life Cycle: Goal: Chance of risk for complications during the postpartum period will decrease Outcome: Progressing    Problem: Role Relationship: Goal: Ability to demonstrate positive interaction with newborn will improve Outcome: Progressing   Problem: Skin Integrity: Goal: Demonstration of wound healing without infection will improve Outcome: Progressing   Problem: Education: Goal: Knowledge of condition will improve Outcome: Progressing Goal: Individualized Educational Video(s) Outcome: Progressing Goal: Individualized Newborn Educational Video(s) Outcome: Progressing   Problem: Activity: Goal: Will verbalize the importance of balancing activity with adequate rest periods Outcome: Progressing Goal: Ability to tolerate increased activity will improve Outcome: Progressing   Problem: Coping: Goal:   Ability to identify and utilize available resources and services will improve Outcome: Progressing   Problem: Life Cycle: Goal: Chance of risk for complications during the postpartum period will decrease Outcome: Progressing   

## 2022-12-13 NOTE — Transfer of Care (Signed)
Immediate Anesthesia Transfer of Care Note  Patient: Summer Pruitt  Procedure(s) Performed: CESAREAN SECTION  Patient Location: PACU  Anesthesia Type:Epidural  Level of Consciousness: awake  Airway & Oxygen Therapy: Patient Spontanous Breathing  Post-op Assessment: Report given to RN and Post -op Vital signs reviewed and stable  Post vital signs: Reviewed and stable  Last Vitals:  Vitals Value Taken Time  BP 104/71 12/13/22 1424  Temp    Pulse 102 12/13/22 1427  Resp 17 12/13/22 1427  SpO2 100 % 12/13/22 1427  Vitals shown include unfiled device data.  Last Pain:  Vitals:   12/13/22 1212  TempSrc:   PainSc: 0-No pain      Patients Stated Pain Goal: 0 (12/13/22 0454)  Complications: No notable events documented.

## 2022-12-13 NOTE — Anesthesia Postprocedure Evaluation (Signed)
Anesthesia Post Note  Patient: Summer Pruitt  Procedure(s) Performed: CESAREAN SECTION     Patient location during evaluation: PACU Anesthesia Type: Epidural Level of consciousness: oriented and awake and alert Pain management: pain level controlled Vital Signs Assessment: post-procedure vital signs reviewed and stable Respiratory status: spontaneous breathing, respiratory function stable and nonlabored ventilation Cardiovascular status: blood pressure returned to baseline and stable Postop Assessment: no headache, no backache, no apparent nausea or vomiting, epidural receding and patient able to bend at knees Anesthetic complications: no   No notable events documented.  Last Vitals:  Vitals:   12/13/22 1500 12/13/22 1515  BP: 97/62 (!) 105/57  Pulse: (!) 106 97  Resp: 18 14  Temp:  37 C  SpO2: 100% 100%    Last Pain:  Vitals:   12/13/22 1515  TempSrc: Oral  PainSc: 0-No pain   Pain Goal: Patients Stated Pain Goal: 0 (12/13/22 0454)  LLE Motor Response: Purposeful movement (12/13/22 1515) LLE Sensation: Tingling (12/13/22 1515) RLE Motor Response: Purposeful movement (12/13/22 1515) RLE Sensation: Tingling (12/13/22 1515)        Winta Barcelo A.

## 2022-12-13 NOTE — H&P (Signed)
Summer Pruitt is a 27 year old G 1 p 0 at 40 w 3 days presents in labor. SROM while on L and D - now with epidural. OB History     Gravida  1   Para      Term      Preterm      AB      Living         SAB      IAB      Ectopic      Multiple      Live Births             Past Medical History:  Diagnosis Date   Breast discharge    bilateral, spontaneous/with compression, clear-milky    Complication of anesthesia    Depression    Endometriosis    Ovarian cyst    Pelvic pain    PONV (postoperative nausea and vomiting)    PTSD (post-traumatic stress disorder)    brother committed suicide   Wears glasses    Past Surgical History:  Procedure Laterality Date   LAPAROSCOPY N/A 07/04/2016   Procedure: LAPAROSCOPY DIAGNOSTIC possible fulgeration of endometriosis;  Surgeon: Summer Cairo, MD;  Location: [facility];  Service: Gynecology;  Laterality: N/A;   TONSILLECTOMY     TYMPANOSTOMY TUBE PLACEMENT Bilateral child   Family History: family history includes Suicidality in her brother. Social History:  reports that she has never smoked. She has never used smokeless tobacco. She reports that she does not currently use alcohol. She reports that she does not currently use drugs.     Maternal Diabetes: No Genetic Screening: Normal Maternal Ultrasounds/Referrals: Normal Fetal Ultrasounds or other Referrals:  None Maternal Substance Abuse:  No Significant Maternal Medications:  None Significant Maternal Lab Results:  None Number of Prenatal Visits:greater than 3 verified prenatal visits Other Comments:  None  Review of Systems Maternal Medical History:  Reason for admission: Contractions.     Dilation: 6 Effacement (%): 70, 80 Station: -1 Exam by:: C. Derrill Kay, RNC Blood pressure (!) 106/51, pulse (!) 110, temperature 98.4 F (36.9 C), temperature source Oral, resp. rate 18. Maternal Exam:  Abdomen: Fetal presentation: vertex   Fetal  Exam Fetal State Assessment: Category I - tracings are normal.   Physical Exam Vitals and nursing note reviewed. Exam conducted with a chaperone present.  Constitutional:      Appearance: Normal appearance.  HENT:     Head: Normocephalic.  Cardiovascular:     Rate and Rhythm: Normal rate and regular rhythm.     Pulses: Normal pulses.  Neurological:     Mental Status: She is alert.     Prenatal labs: ABO, Rh: --/--/A POS (09/03 0120) Antibody: NEG (09/03 0120) Rubella:   RPR:    HBsAg:    HIV:    GBS:     Assessment/Plan: IUP at term Labor Anticipate NSVD  Summer Pruitt 12/13/2022, 7:44 AM

## 2022-12-13 NOTE — Brief Op Note (Signed)
12/13/2022  2:04 PM  PATIENT:  Summer Pruitt  27 y.o. female  PRE-OPERATIVE DIAGNOSIS:   IUP at 21 w 3 days Failiure to progress Non reassuring FHR pattern  POST-OPERATIVE DIAGNOSIS:   Same  PROCEDURE:  Procedure(s): CESAREAN SECTION (N/A)  SURGEON:  Surgeons and Role:    * Marcelle Overlie, MD - Primary  PHYSICIAN ASSISTANT:   ASSISTANTS: none   ANESTHESIA:   epidural  EBL:  300 mL   BLOOD ADMINISTERED:none  DRAINS: Urinary Catheter (Foley)   LOCAL MEDICATIONS USED:  NONE  SPECIMEN:  No Specimen  DISPOSITION OF SPECIMEN:  N/A  COUNTS:  YES  TOURNIQUET:  * No tourniquets in log *  DICTATION: .Other Dictation: Dictation Number dictated  PLAN OF CARE: Admit to inpatient   PATIENT DISPOSITION:  PACU - hemodynamically stable.   Delay start of Pharmacological VTE agent (>24hrs) due to surgical blood loss or risk of bleeding: not applicable

## 2022-12-14 ENCOUNTER — Encounter (HOSPITAL_COMMUNITY): Payer: Self-pay | Admitting: Obstetrics and Gynecology

## 2022-12-14 LAB — CBC
HCT: 24.5 % — ABNORMAL LOW (ref 36.0–46.0)
Hemoglobin: 8.2 g/dL — ABNORMAL LOW (ref 12.0–15.0)
MCH: 30.5 pg (ref 26.0–34.0)
MCHC: 33.5 g/dL (ref 30.0–36.0)
MCV: 91.1 fL (ref 80.0–100.0)
Platelets: 151 10*3/uL (ref 150–400)
RBC: 2.69 MIL/uL — ABNORMAL LOW (ref 3.87–5.11)
RDW: 14.9 % (ref 11.5–15.5)
WBC: 12.5 10*3/uL — ABNORMAL HIGH (ref 4.0–10.5)
nRBC: 0 % (ref 0.0–0.2)

## 2022-12-14 MED ORDER — FERROUS SULFATE 325 (65 FE) MG PO TABS
325.0000 mg | ORAL_TABLET | Freq: Every day | ORAL | Status: DC
  Administered 2022-12-14 – 2022-12-16 (×3): 325 mg via ORAL
  Filled 2022-12-14 (×3): qty 1

## 2022-12-14 NOTE — Lactation Note (Signed)
This note was copied from a baby's chart. Lactation Consultation Note  Patient Name: Summer Pruitt [id number] Date: 12/14/2022 Age:26 hours Reason for consult: Initial assessment;Term;1st time breastfeeding.  Birth Parent latched infant on her left breast using the football hold position, infant sustained latch and was still breastfeeding after 18 minutes. Birth Parent will continue to BF infant by cues, on demand every 2-3 hours, skin to skin. Birth Parent knows to call RN/LC for further latch assistance if needed. LC discussed infant's input and output and infant had one stool since birth. LC discussed importance of maternal rest, diet and hydration. Birth Parent  made aware of O/P services, breastfeeding support groups, community resources, and our phone # for post-discharge questions.    Maternal Data Has patient been taught Hand Expression?: Yes Does the patient have breastfeeding experience prior to this delivery?: No  Feeding Mother's Current Feeding Choice: Breast Milk  LATCH Score Latch: Grasps breast easily, tongue down, lips flanged, rhythmical sucking.  Audible Swallowing: Spontaneous and intermittent  Type of Nipple: Everted at rest and after stimulation  Comfort (Breast/Nipple): Soft / non-tender  Hold (Positioning): Assistance needed to correctly position infant at breast and maintain latch.  LATCH Score: 9   Lactation Tools Discussed/Used    Interventions Interventions: Breast feeding basics reviewed;Assisted with latch;Skin to skin;Breast compression;Adjust position;Support pillows;Position options;Education;LC Services brochure  Discharge Pump: DEBP;Personal  Consult Status Consult Status: Follow-up Date: 12/15/22 Follow-up type: In-patient    Frederico Hamman 12/14/2022, 2:27 AM

## 2022-12-14 NOTE — Lactation Note (Signed)
This note was copied from a baby's chart. Lactation Consultation Note  Patient Name: Summer Pruitt [id number] Date: 12/14/2022 Age:27 hours Reason for consult: Follow-up assessment;Term;1st time breastfeeding  Visited P1 parent for follow up consult. Upon entry, mom was shaky and reported she was in a lot of pain. She stated she knew she needed to latch her baby but that she was in a lot of pain "in her incision". Nurse walked in to deliver pain meds.  LC will come back to check on mom for feeding assist.   Maternal Data Has patient been taught Hand Expression?: Yes Does the patient have breastfeeding experience prior to this delivery?: No  Feeding Mother's Current Feeding Choice: Breast Milk  Interventions Interventions: Breast feeding basics reviewed  Consult Status Consult Status: Follow-up Date: 12/15/22 Follow-up type: In-patient  [name] 12/14/2022, 5:53 PM

## 2022-12-14 NOTE — Progress Notes (Signed)
CSW received a consult for ADD and eating disorders. At this time ADD is not a mood disorder which does not warrant a CSW involvement. The eating disorder was not relevant during this pregnancy according to the OB notes.    MOB was referred for history of depression/anxiety.  * Referral screened out by Clinical Social Worker because none of the following criteria appear to apply:  ~ History of anxiety/depression during this pregnancy, or of post-partum depression following prior delivery.  ~ Diagnosis of anxiety and/or depression within last 3 years  Peer OB notes, MOB did not indicate any signs/symptoms during this pregnancy.  OR  * MOB's symptoms currently being treated with medication and/or therapy.  Please contact the Clinical Social Worker if needs arise, by [facility] request, or if MOB scores greater than 9/yes to question 10 on Edinburgh Postpartum Depression Screen.  Enos Fling, Theresia Majors Clinical Social Worker [phone]

## 2022-12-14 NOTE — Plan of Care (Signed)
Problem: Education: Goal: Knowledge of Childbirth will improve Outcome: Progressing Goal: Ability to make informed decisions regarding treatment and plan of care will improve Outcome: Progressing Goal: Ability to state and carry out methods to decrease the pain will improve Outcome: Progressing Goal: Individualized Educational Video(s) Outcome: Progressing   Problem: Coping: Goal: Ability to verbalize concerns and feelings about labor and delivery will improve Outcome: Progressing   Problem: Life Cycle: Goal: Ability to make normal progression through stages of labor will improve Outcome: Progressing Goal: Ability to effectively push during vaginal delivery will improve Outcome: Progressing   Problem: Role Relationship: Goal: Will demonstrate positive interactions with the child Outcome: Progressing   Problem: Safety: Goal: Risk of complications during labor and delivery will decrease Outcome: Progressing   Problem: Pain Management: Goal: Relief or control of pain from uterine contractions will improve Outcome: Progressing   Problem: Education: Goal: Knowledge of General Education information will improve Description: Including pain rating scale, medication(s)/side effects and non-pharmacologic comfort measures Outcome: Progressing   Problem: Health Behavior/Discharge Planning: Goal: Ability to manage health-related needs will improve Outcome: Progressing   Problem: Clinical Measurements: Goal: Ability to maintain clinical measurements within normal limits will improve Outcome: Progressing Goal: Will remain free from infection Outcome: Progressing Goal: Diagnostic test results will improve Outcome: Progressing Goal: Respiratory complications will improve Outcome: Progressing Goal: Cardiovascular complication will be avoided Outcome: Progressing   Problem: Activity: Goal: Risk for activity intolerance will decrease Outcome: Progressing   Problem:  Nutrition: Goal: Adequate nutrition will be maintained Outcome: Progressing   Problem: Coping: Goal: Level of anxiety will decrease Outcome: Progressing   Problem: Elimination: Goal: Will not experience complications related to bowel motility Outcome: Progressing Goal: Will not experience complications related to urinary retention Outcome: Progressing   Problem: Pain Managment: Goal: General experience of comfort will improve Outcome: Progressing   Problem: Safety: Goal: Ability to remain free from injury will improve Outcome: Progressing   Problem: Skin Integrity: Goal: Risk for impaired skin integrity will decrease Outcome: Progressing   Problem: Education: Goal: Knowledge of the prescribed therapeutic regimen will improve Outcome: Progressing Goal: Understanding of sexual limitations or changes related to disease process or condition will improve Outcome: Progressing Goal: Individualized Educational Video(s) Outcome: Progressing   Problem: Self-Concept: Goal: Communication of feelings regarding changes in body function or appearance will improve Outcome: Progressing   Problem: Skin Integrity: Goal: Demonstration of wound healing without infection will improve Outcome: Progressing   Problem: Education: Goal: Knowledge of condition will improve Outcome: Progressing Goal: Individualized Educational Video(s) Outcome: Progressing Goal: Individualized Newborn Educational Video(s) Outcome: Progressing   Problem: Activity: Goal: Will verbalize the importance of balancing activity with adequate rest periods Outcome: Progressing Goal: Ability to tolerate increased activity will improve Outcome: Progressing   Problem: Coping: Goal: Ability to identify and utilize available resources and services will improve Outcome: Progressing   Problem: Life Cycle: Goal: Chance of risk for complications during the postpartum period will decrease Outcome: Progressing    Problem: Role Relationship: Goal: Ability to demonstrate positive interaction with newborn will improve Outcome: Progressing   Problem: Skin Integrity: Goal: Demonstration of wound healing without infection will improve Outcome: Progressing   Problem: Education: Goal: Knowledge of condition will improve Outcome: Progressing Goal: Individualized Educational Video(s) Outcome: Progressing Goal: Individualized Newborn Educational Video(s) Outcome: Progressing   Problem: Activity: Goal: Will verbalize the importance of balancing activity with adequate rest periods Outcome: Progressing Goal: Ability to tolerate increased activity will improve Outcome: Progressing   Problem: Coping: Goal:   Ability to identify and utilize available resources and services will improve Outcome: Progressing   Problem: Life Cycle: Goal: Chance of risk for complications during the postpartum period will decrease Outcome: Progressing   

## 2022-12-14 NOTE — Progress Notes (Signed)
Subjective: Postpartum Day 1: Cesarean Delivery Patient reports tolerating PO.    Objective: Vital signs in last 24 hours: Temp:  [97.8 F (36.6 C)-100.8 F (38.2 C)] 97.8 F (36.6 C) (09/04 4132) Pulse Rate:  [79-135] 80 (09/04 0613) Resp:  [14-18] 18 (09/04 0613) BP: (97-116)/(46-71) 99/46 (09/04 0613) SpO2:  [98 %-100 %] 99 % (09/04 4401) Weight:  [98.9 kg] 98.9 kg (09/03 1707)  Physical Exam:  General: alert and cooperative Lochia: appropriate Uterine Fundus: firm Incision: healing well DVT Evaluation: No evidence of DVT seen on physical exam.  Recent Labs    12/13/22 0120 12/14/22 0458  HGB 12.2 8.2*  HCT 36.2 24.5*    Assessment/Plan: Status post Cesarean section. Doing well postoperatively.  Continue current care. Baby boy has not voided and circ is deferred until he does.   Ranae Pila, MD 12/14/2022, 8:10 AM

## 2022-12-15 NOTE — Plan of Care (Signed)
Problem: Education: Goal: Knowledge of Childbirth will improve Outcome: Progressing Goal: Ability to make informed decisions regarding treatment and plan of care will improve Outcome: Progressing Goal: Ability to state and carry out methods to decrease the pain will improve Outcome: Progressing Goal: Individualized Educational Video(s) Outcome: Progressing   Problem: Coping: Goal: Ability to verbalize concerns and feelings about labor and delivery will improve Outcome: Progressing   Problem: Life Cycle: Goal: Ability to make normal progression through stages of labor will improve Outcome: Progressing Goal: Ability to effectively push during vaginal delivery will improve Outcome: Progressing   Problem: Role Relationship: Goal: Will demonstrate positive interactions with the child Outcome: Progressing   Problem: Safety: Goal: Risk of complications during labor and delivery will decrease Outcome: Progressing   Problem: Pain Management: Goal: Relief or control of pain from uterine contractions will improve Outcome: Progressing   Problem: Education: Goal: Knowledge of General Education information will improve Description: Including pain rating scale, medication(s)/side effects and non-pharmacologic comfort measures Outcome: Progressing   Problem: Health Behavior/Discharge Planning: Goal: Ability to manage health-related needs will improve Outcome: Progressing   Problem: Clinical Measurements: Goal: Ability to maintain clinical measurements within normal limits will improve Outcome: Progressing Goal: Will remain free from infection Outcome: Progressing Goal: Diagnostic test results will improve Outcome: Progressing Goal: Respiratory complications will improve Outcome: Progressing Goal: Cardiovascular complication will be avoided Outcome: Progressing   Problem: Activity: Goal: Risk for activity intolerance will decrease Outcome: Progressing   Problem:  Nutrition: Goal: Adequate nutrition will be maintained Outcome: Progressing   Problem: Coping: Goal: Level of anxiety will decrease Outcome: Progressing   Problem: Elimination: Goal: Will not experience complications related to bowel motility Outcome: Progressing Goal: Will not experience complications related to urinary retention Outcome: Progressing   Problem: Pain Managment: Goal: General experience of comfort will improve Outcome: Progressing   Problem: Safety: Goal: Ability to remain free from injury will improve Outcome: Progressing   Problem: Skin Integrity: Goal: Risk for impaired skin integrity will decrease Outcome: Progressing   Problem: Education: Goal: Knowledge of the prescribed therapeutic regimen will improve Outcome: Progressing Goal: Understanding of sexual limitations or changes related to disease process or condition will improve Outcome: Progressing Goal: Individualized Educational Video(s) Outcome: Progressing   Problem: Self-Concept: Goal: Communication of feelings regarding changes in body function or appearance will improve Outcome: Progressing   Problem: Skin Integrity: Goal: Demonstration of wound healing without infection will improve Outcome: Progressing   Problem: Education: Goal: Knowledge of condition will improve Outcome: Progressing Goal: Individualized Educational Video(s) Outcome: Progressing Goal: Individualized Newborn Educational Video(s) Outcome: Progressing   Problem: Activity: Goal: Will verbalize the importance of balancing activity with adequate rest periods Outcome: Progressing Goal: Ability to tolerate increased activity will improve Outcome: Progressing   Problem: Coping: Goal: Ability to identify and utilize available resources and services will improve Outcome: Progressing   Problem: Life Cycle: Goal: Chance of risk for complications during the postpartum period will decrease Outcome: Progressing    Problem: Role Relationship: Goal: Ability to demonstrate positive interaction with newborn will improve Outcome: Progressing   Problem: Skin Integrity: Goal: Demonstration of wound healing without infection will improve Outcome: Progressing   Problem: Education: Goal: Knowledge of condition will improve Outcome: Progressing Goal: Individualized Educational Video(s) Outcome: Progressing Goal: Individualized Newborn Educational Video(s) Outcome: Progressing   Problem: Activity: Goal: Will verbalize the importance of balancing activity with adequate rest periods Outcome: Progressing Goal: Ability to tolerate increased activity will improve Outcome: Progressing   Problem: Coping: Goal:   Ability to identify and utilize available resources and services will improve Outcome: Progressing   Problem: Life Cycle: Goal: Chance of risk for complications during the postpartum period will decrease Outcome: Progressing   

## 2022-12-15 NOTE — Lactation Note (Signed)
This note was copied from a baby's chart. Lactation Consultation Note  Patient Name: Summer Pruitt [id number] Date: 12/15/2022 Age:26 hours  Reason for consult: Primapara;1st time breastfeeding;Follow-up assessment;Term  P1, [redacted]w[redacted]d, 5.58% weight loss  Mother receptive [facility] visit. Mother is able to latch baby independently, somewhat shallow. Mother agreeable to adjusting pillows and position for a deeper latch which aids in milk transfer. Mother has large amount of colostrum that srays with compression. Audible swallows were noted with infant's feeding. Infant was also fed 2.5 ml of mother's colostrum via finger feeding and curved tip syringe.   LATCH Score Latch: Grasps breast easily, tongue down, lips flanged, rhythmical sucking.  Audible Swallowing: Spontaneous and intermittent  Type of Nipple: Everted at rest and after stimulation  Comfort (Breast/Nipple): Soft / non-tender  Hold (Positioning): Assistance needed to correctly position infant at breast and maintain latch.  LATCH Score: 9    Interventions Interventions: Breast feeding basics reviewed;Assisted with latch;Hand express;Breast compression;Adjust position;Support pillows;Education  Discharge Pump: Personal (Spectra)  Consult Status Consult Status: Complete Date: 12/15/22 Follow-up type: In-patient    Christella Hartigan M 12/15/2022, 9:45 AM

## 2022-12-15 NOTE — Progress Notes (Signed)
Subjective: Postpartum Day 2: Cesarean Delivery Patient reports incisional pain, tolerating PO, + flatus, and no problems voiding.    Objective: Vital signs in last 24 hours: Temp:  [98.2 F (36.8 C)-98.6 F (37 C)] 98.2 F (36.8 C) (09/05 0546) Pulse Rate:  [87-109] 87 (09/05 0546) Resp:  [16-18] 16 (09/05 0546) BP: (95-106)/(55-59) 99/58 (09/05 0546) SpO2:  [99 %-100 %] 99 % (09/05 0546)  Physical Exam:  General: alert, cooperative, appears stated age, and mild distress Lochia: appropriate Uterine Fundus: firm Incision: healing well DVT Evaluation: No evidence of DVT seen on physical exam.  Recent Labs    12/13/22 0120 12/14/22 0458  HGB 12.2 8.2*  HCT 36.2 24.5*    Assessment/Plan: Status post Cesarean section. Doing well postoperatively. Desires circ of baby boy, peds wants to defer until tomorrow    .  Turner Daniels, MD 12/15/2022, 10:02 AM

## 2022-12-16 ENCOUNTER — Encounter (HOSPITAL_COMMUNITY): Payer: Self-pay | Admitting: Obstetrics and Gynecology

## 2022-12-16 MED ORDER — FERROUS SULFATE 325 (65 FE) MG PO TABS: 325.0000 mg | ORAL_TABLET | Freq: Every day | ORAL | 3 refills | Status: AC

## 2022-12-16 MED ORDER — OXYCODONE HCL 5 MG PO TABS: 5.0000 mg | ORAL_TABLET | Freq: Four times a day (QID) | ORAL | 0 refills | Status: AC | PRN

## 2022-12-16 NOTE — Discharge Summary (Signed)
Postpartum Discharge Summary  Date of Service updated 12/16/22     Patient Name: Summer Pruitt DOB: 03-20-1996 MRN: [id number]  Date of admission: 12/13/2022 Delivery date:12/13/2022 Delivering provider: Marcelle Overlie Date of discharge: 12/16/2022  Admitting diagnosis: Normal labor and delivery [O80] S/P cesarean section [Z98.891] Intrauterine pregnancy: [redacted]w[redacted]d     Secondary diagnosis:  Principal Problem:   Normal labor and delivery Active Problems:   S/P cesarean section  Additional problems:     Discharge diagnosis: Term Pregnancy Delivered                                              Post partum procedures: Augmentation: N/A Complications: None  Hospital course: Onset of Labor With Unplanned C/S   27 y.o. yo G1P1001 at [redacted]w[redacted]d was admitted in Active Labor on 12/13/2022. Patient had a labor course significant for . The patient went for cesarean section due to Arrest of Dilation. Delivery details as follows: Membrane Rupture Time/Date: 3:15 AM,12/13/2022  Delivery Method:C-Section, Low Transverse Operative Delivery: Details of operation can be found in separate operative note. Patient had a postpartum course complicated by.  She is ambulating,tolerating a regular diet, passing flatus, and urinating well.  Patient is discharged home in stable condition 12/16/22.  Newborn Data: Birth date:12/13/2022 Birth time:1:37 PM Gender:Female Living status:Living Apgars:8 ,9  Weight:3940 g  Magnesium Sulfate received: No BMZ received: No Rhophylac:No MMR:No T-DaP:Given prenatally Flu: No Transfusion:No  Physical exam  Vitals:   12/15/22 0546 12/15/22 1422 12/15/22 2319 12/16/22 0500  BP: (!) 99/58 (!) 110/59 113/68 112/65  Pulse: 87 100  85  Resp: 16 17 16 17   Temp: 98.2 F (36.8 C) 98 F (36.7 C)  98.4 F (36.9 C)  TempSrc: Oral Oral    SpO2: 99%     Weight:      Height:       General: alert, cooperative, and no distress Lochia: appropriate Uterine Fundus:  firm Incision: Healing well with no significant drainage DVT Evaluation: No evidence of DVT seen on physical exam. Labs: Lab Results  Component Value Date   WBC 12.5 (H) 12/14/2022   HGB 8.2 (L) 12/14/2022   HCT 24.5 (L) 12/14/2022   MCV 91.1 12/14/2022   PLT 151 12/14/2022      Latest Ref Rng & Units 08/26/2021    2:45 PM  CMP  Glucose 70 - 99 mg/dL 90   BUN 6 - 20 mg/dL 6   Creatinine 7.82 - 9.56 mg/dL 2.13   Sodium 086 - 578 mmol/L 139   Potassium 3.5 - 5.1 mmol/L 3.5   Chloride 98 - 111 mmol/L 105   CO2 22 - 32 mmol/L 25   Calcium 8.9 - 10.3 mg/dL 9.5   Total Protein 6.5 - 8.1 g/dL 7.3   Total Bilirubin 0.3 - 1.2 mg/dL 0.7   Alkaline Phos 38 - 126 U/L 45   AST 15 - 41 U/L 21   ALT 0 - 44 U/L 16    Edinburgh Score:    12/13/2022    4:45 PM  Edinburgh Postnatal Depression Scale Screening Tool  I have been able to laugh and see the funny side of things. 0  I have looked forward with enjoyment to things. 0  I have blamed myself unnecessarily when things went wrong. 0  I have been anxious or worried for no good  reason. 0  I have felt scared or panicky for no good reason. 0  Things have been getting on top of me. 0  I have been so unhappy that I have had difficulty sleeping. 0  I have felt sad or miserable. 0  I have been so unhappy that I have been crying. 0  The thought of harming myself has occurred to me. 0  Edinburgh Postnatal Depression Scale Total 0      After visit meds:  Allergies as of 12/16/2022       Reactions   Amoxicillin Anaphylaxis, Shortness Of Breath, Rash   Has patient had a PCN reaction causing immediate rash, facial/tongue/throat swelling, SOB or lightheadedness with hypotension: Yes Has patient had a PCN reaction causing severe rash involving mucus membranes or skin necrosis: No Has patient had a PCN reaction that required hospitalization: No Has patient had a PCN reaction occurring within the last 10 years: Yes If all of the above answers are  "NO", then may proceed with Cephalosporin use.   Doxycycline Anaphylaxis, Shortness Of Breath, Rash   Zithromax [azithromycin] Anaphylaxis, Shortness Of Breath, Rash   Cefdinir Rash   Myalgias   Latex Rash        Medication List     STOP taking these medications    Adderall 10 MG tablet Generic drug: amphetamine-dextroamphetamine   ALPRAZolam 0.5 MG tablet Commonly known as: XANAX   EPINEPHrine 0.3 mg/0.3 mL Soaj injection Commonly known as: EPI-PEN   ferrous sulfate 324 MG Tbec Replaced by: ferrous sulfate 325 (65 FE) MG tablet   HYDROcodone-acetaminophen 5-325 MG tablet Commonly known as: NORCO/VICODIN   ketorolac 10 MG tablet Commonly known as: TORADOL   loperamide 2 MG capsule Commonly known as: IMODIUM   multivitamin-prenatal 27-0.8 MG Tabs tablet   ondansetron 4 MG disintegrating tablet Commonly known as: Zofran ODT   ProAir HFA 108 (90 Base) MCG/ACT inhaler Generic drug: albuterol   Zafemy 150-35 MCG/24HR transdermal patch Generic drug: norelgestromin-ethinyl estradiol       TAKE these medications    acetaminophen 325 MG tablet Commonly known as: TYLENOL Take 650 mg by mouth every 6 (six) hours as needed for moderate pain.   ferrous sulfate 325 (65 FE) MG tablet Take 1 tablet (325 mg total) by mouth daily with breakfast. Replaces: ferrous sulfate 324 MG Tbec   ibuprofen 200 MG tablet Commonly known as: ADVIL Take 600 mg by mouth every 6 (six) hours as needed for moderate pain.   oxyCODONE 5 MG immediate release tablet Commonly known as: Oxy IR/ROXICODONE Take 1 tablet (5 mg total) by mouth every 6 (six) hours as needed for moderate pain.         Discharge home in stable condition Infant Feeding: Breast Infant Disposition:home with mother Discharge instruction: per After Visit Summary and Postpartum booklet. Activity: Advance as tolerated. Pelvic rest for 6 weeks.  Diet: routine diet Anticipated Birth Control: Unsure Postpartum  Appointment:6 weeks Additional Postpartum F/U:  Future Appointments:No future appointments. Follow up Visit:      12/16/2022 Leslie Andrea, MD

## 2022-12-16 NOTE — Progress Notes (Signed)
Subjective: Postpartum Day 3: Cesarean Delivery Patient reports tolerating PO, + flatus, + BM, and no problems voiding.    Objective: Vital signs in last 24 hours: Temp:  [98 F (36.7 C)-98.4 F (36.9 C)] 98.4 F (36.9 C) (09/06 0500) Pulse Rate:  [85-100] 85 (09/06 0500) Resp:  [16-17] 17 (09/06 0500) BP: (110-113)/(59-68) 112/65 (09/06 0500)  Physical Exam:  General: alert, cooperative, and no distress Lochia: appropriate Uterine Fundus: firm Incision: healing well DVT Evaluation: No evidence of DVT seen on physical exam.  Recent Labs    12/14/22 0458  HGB 8.2*  HCT 24.5*    Assessment/Plan: Status post Cesarean section. Doing well postoperatively.  Discharge home with standard precautions and return to clinic in 4-6 weeks D/W circumcision on newborn boy, risks reviewed. She states she understands and agrees  Leslie Andrea, MD 12/16/2022, 6:58 AM

## 2022-12-17 ENCOUNTER — Inpatient Hospital Stay (HOSPITAL_COMMUNITY)
Admission: RE | Admit: 2022-12-17 | Payer: Medicaid Other | Source: Home / Self Care | Admitting: Obstetrics and Gynecology

## 2022-12-17 ENCOUNTER — Inpatient Hospital Stay (HOSPITAL_COMMUNITY): Payer: Medicaid Other

## 2023-01-14 ENCOUNTER — Telehealth (HOSPITAL_COMMUNITY): Payer: Self-pay | Admitting: *Deleted

## 2023-01-14 NOTE — Telephone Encounter (Signed)
Attempted hospital discharge follow-up call. Left message for patient to return RN call with any questions or concerns. Deforest Hoyles, RN, 01/14/23, [phone]

## 2023-11-23 ENCOUNTER — Ambulatory Visit
Admission: EM | Admit: 2023-11-23 | Discharge: 2023-11-23 | Disposition: A | Discharge status: Home / Self Care | Attending: Family Medicine | Admitting: Family Medicine

## 2023-11-23 DIAGNOSIS — H60502 Unspecified acute noninfective otitis externa, left ear: Secondary | ICD-10-CM | POA: Diagnosis not present

## 2023-11-23 MED ORDER — NEOMYCIN-POLYMYXIN-HC 3.5-10000-1 OT SUSP
4.0000 [drp] | Freq: Three times a day (TID) | OTIC | 0 refills | Status: AC
Start: 2023-11-23 — End: 2023-11-28

## 2023-11-23 NOTE — ED Triage Notes (Signed)
 Pt present with lt ear pain x four days. Pt states her pain has worsened and has taken used swimmers ear drops. States her mom looked in her ear and noticed it was red.

## 2023-11-23 NOTE — ED Provider Notes (Signed)
 [facility]    CSN: [id number] Arrival date & time: 11/23/23  0849      History   Chief Complaint Chief Complaint  Patient presents with   Otalgia    HPI Summer Pruitt is a 28 y.o. female presents for ear pain.  Patient reports 4 days of left ear pain with some mild drainage.  Denies any fevers, cough or congestion, recent swimming.  No change in hearing.  Has been using over-the-counter swimmer's ear drops with minimal improvement.  Patient denies pregnancy but is breast-feeding.  No other concerns at this time   Otalgia   Past Medical History:  Diagnosis Date   Breast discharge    bilateral, spontaneous/with compression, clear-milky    Complication of anesthesia    Depression    Endometriosis    Ovarian cyst    Pelvic pain    PONV (postoperative nausea and vomiting)    PTSD (post-traumatic stress disorder)    brother committed suicide   Wears glasses     Patient Active Problem List   Diagnosis Date Noted   Normal labor and delivery 12/13/2022   S/P cesarean section 12/13/2022   Pneumomediastinum (HCC) 01/04/2018   GAD (generalized anxiety disorder) 01/13/2017   ADHD, 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   Depression 08/29/2015    Past Surgical History:  Procedure Laterality Date   CESAREAN SECTION N/A 12/13/2022   Procedure: CESAREAN SECTION;  Surgeon: Mat Browning, MD;  Location: [facility] OR;  Service: Obstetrics;  Laterality: N/A;   LAPAROSCOPY N/A 07/04/2016   Procedure: LAPAROSCOPY DIAGNOSTIC possible fulgeration of endometriosis;  Surgeon: Truman Corona, MD;  Location: [facility];  Service: Gynecology;  Laterality: N/A;   TONSILLECTOMY     TYMPANOSTOMY TUBE PLACEMENT Bilateral child    OB History     Gravida  1   Para  1   Term  1   Preterm      AB      Living  1      SAB      IAB      Ectopic      Multiple  0   Live Births  1            Home Medications    Prior to Admission medications    Medication Sig Start Date End Date Taking? Authorizing Provider  neomycin-polymyxin-hydrocortisone (CORTISPORIN) 3.5-10000-1 OTIC suspension Place 4 drops into the left ear 3 (three) times daily for 5 days. 11/23/23 11/28/23 Yes Parris Signer, Jodi R, NP  acetaminophen (TYLENOL) 325 MG tablet Take 650 mg by mouth every 6 (six) hours as needed for moderate pain.    [provider]  ferrous sulfate 325 (65 FE) MG tablet Take 1 tablet (325 mg total) by mouth daily with breakfast. 12/16/22   Tomblin, James, MD  ibuprofen (ADVIL) 200 MG tablet Take 600 mg by mouth every 6 (six) hours as needed for moderate pain.    [provider]  oxyCODONE (OXY IR/ROXICODONE) 5 MG immediate release tablet Take 1 tablet (5 mg total) by mouth every 6 (six) hours as needed for moderate pain. 12/16/22   Curlene Agent, MD    Family History Family History  Problem Relation Age of Onset   Suicidality Brother     Social History Social History   Tobacco Use   Smoking status: Never   Smokeless tobacco: Never  Vaping Use   Vaping status: Every Day  Substance Use Topics   Alcohol use: Not Currently  Drug use: Not Currently     Allergies   Amoxicillin, Doxycycline, Zithromax [azithromycin], Cefdinir, and Latex   Review of Systems Review of Systems  HENT:  Positive for ear pain.      Physical Exam Triage Vital Signs ED Triage Vitals  Encounter Vitals Group     BP 11/23/23 0901 111/72     Girls Systolic BP Percentile --      Girls Diastolic BP Percentile --      Boys Systolic BP Percentile --      Boys Diastolic BP Percentile --      Pulse Rate 11/23/23 0901 74     Resp 11/23/23 0901 17     Temp 11/23/23 0901 98 F (36.7 C)     Temp Source 11/23/23 0901 Oral     SpO2 11/23/23 0901 98 %     Weight --      Height --      Head Circumference --      Peak Flow --      Pain Score 11/23/23 0900 4     Pain Loc --      Pain Education --      Exclude from Growth Chart --    No data  found.  Updated Vital Signs BP 111/72 (BP Location: Right Arm)   Pulse 74   Temp 98 F (36.7 C) (Oral)   Resp 17   LMP 10/27/2023 (Exact Date)   SpO2 98%   Breastfeeding No   Visual Acuity Right Eye Distance:   Left Eye Distance:   Bilateral Distance:    Right Eye Near:   Left Eye Near:    Bilateral Near:     Physical Exam Vitals and nursing note reviewed.  Constitutional:      General: She is not in acute distress.    Appearance: Normal appearance. She is not ill-appearing.  HENT:     Head: Normocephalic and atraumatic.     Right Ear: Tympanic membrane and ear canal normal.     Left Ear: Tympanic membrane normal. Swelling present. No mastoid tenderness. Tympanic membrane is not erythematous.     Ears:     Comments: Mild erythema and swelling of the left canal with minimal discharge. Eyes:     Pupils: Pupils are equal, round, and reactive to light.  Cardiovascular:     Rate and Rhythm: Normal rate.  Pulmonary:     Effort: Pulmonary effort is normal.  Skin:    General: Skin is warm and dry.  Neurological:     General: No focal deficit present.     Mental Status: She is alert and oriented to person, place, and time.  Psychiatric:        Mood and Affect: Mood normal.        Behavior: Behavior normal.      UC Treatments / Results  Labs (all labs ordered are listed, but only abnormal results are displayed) Labs Reviewed - No data to display  EKG   Radiology No results found.  Procedures Procedures (including critical care time)  Medications Ordered in UC Medications - No data to display  Initial Impression / Assessment and Plan / UC Course  I have reviewed the triage vital signs and the nursing notes.  Pertinent labs & imaging results that were available during my care of the patient were reviewed by me and considered in my medical decision making (see chart for details).     Reviewed exam and symptoms with patient.  No red flags.  Start  Cortisporin antibiotic eardrops as prescribed.  Keep water out of the ear until treatment is complete.  Continue OTC analgesics as needed.  PCP follow-up if symptoms do not improve.  ER precautions reviewed Final Clinical Impressions(s) / UC Diagnoses   Final diagnoses:  Acute otitis externa of left ear, unspecified type     Discharge Instructions      Start course for an antibiotic eardrops 3 times a day for 5 days.  Keep water out of the ear until you are done with treatment.  You may continue Tylenol or ibuprofen as needed for pain.  Please follow-up with your PCP if your symptoms do not improve.  Please go to the ER if you develop any worsening symptoms.  Hope you feel better soon!    ED Prescriptions     Medication Sig Dispense Auth. Provider   neomycin-polymyxin-hydrocortisone (CORTISPORIN) 3.5-10000-1 OTIC suspension Place 4 drops into the left ear 3 (three) times daily for 5 days. 10 mL Reta Norgren, Jodi R, NP      PDMP not reviewed this encounter.   Loreda Myla SAUNDERS, NP 11/23/23 [phone]

## 2023-11-23 NOTE — Discharge Instructions (Addendum)
 Start course for an antibiotic eardrops 3 times a day for 5 days.  Keep water out of the ear until you are done with treatment.  You may continue Tylenol or ibuprofen as needed for pain.  Please follow-up with your PCP if your symptoms do not improve.  Please go to the ER if you develop any worsening symptoms.  Hope you feel better soon!
